# Patient Record
Sex: Male | Born: 1945 | Race: Black or African American | Hispanic: No | Marital: Single | Smoking: Former smoker
Health system: Southern US, Community
[De-identification: ages and names within clinical notes are randomized; demographics above are authoritative.]

## PROBLEM LIST (undated history)

## (undated) DIAGNOSIS — C801 Malignant (primary) neoplasm, unspecified: Secondary | ICD-10-CM

## (undated) DIAGNOSIS — J189 Pneumonia, unspecified organism: Secondary | ICD-10-CM

## (undated) DIAGNOSIS — M1990 Unspecified osteoarthritis, unspecified site: Secondary | ICD-10-CM

## (undated) DIAGNOSIS — T4145XA Adverse effect of unspecified anesthetic, initial encounter: Secondary | ICD-10-CM

## (undated) HISTORY — PX: HERNIA REPAIR: SHX51

## (undated) HISTORY — PX: PROSTATECTOMY: SHX69

---
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
## 2018-03-23 ENCOUNTER — Ambulatory Visit (INDEPENDENT_AMBULATORY_CARE_PROVIDER_SITE_OTHER): Payer: Medicare Other | Admitting: Orthopaedic Surgery

## 2018-04-01 ENCOUNTER — Ambulatory Visit (INDEPENDENT_AMBULATORY_CARE_PROVIDER_SITE_OTHER): Payer: Medicare Other | Admitting: Orthopaedic Surgery

## 2018-04-01 ENCOUNTER — Other Ambulatory Visit (INDEPENDENT_AMBULATORY_CARE_PROVIDER_SITE_OTHER): Payer: Self-pay

## 2018-04-01 ENCOUNTER — Ambulatory Visit (INDEPENDENT_AMBULATORY_CARE_PROVIDER_SITE_OTHER): Payer: Medicare Other

## 2018-04-01 DIAGNOSIS — M25552 Pain in left hip: Secondary | ICD-10-CM | POA: Diagnosis not present

## 2018-04-01 DIAGNOSIS — M1611 Unilateral primary osteoarthritis, right hip: Secondary | ICD-10-CM

## 2018-04-01 DIAGNOSIS — M1612 Unilateral primary osteoarthritis, left hip: Secondary | ICD-10-CM | POA: Diagnosis not present

## 2018-04-01 DIAGNOSIS — M25551 Pain in right hip: Secondary | ICD-10-CM

## 2018-04-01 NOTE — Progress Notes (Signed)
Office Visit Note   Patient: [name]           Date of Birth: September 01, 1945           MRN: [id number] Visit Date: 04/01/2018              Requested by: Leanna Battles, MD [address] [place], [place] [address] PCP: Leanna Battles, MD   Assessment & Plan: Visit Diagnoses:  1. Pain in right hip   2. Unilateral primary osteoarthritis, left hip   3. Unilateral primary osteoarthritis, right hip   4. Pain of left hip joint     Plan: I gave the patient a copy of his x-rays.  He understands that he has severe end-stage arthritis and that running is probably putting more pressure on the hips however he does not want to stop running.  He would like to try at least a one-time intra-articular injection into his right hip and we can set that up under direct fluoroscopy by Dr. Ernestina Patches in the next week or so.  I will see him back in about 2 months to see how he is doing overall.  I did tell him I would not repeat injections over nerve in his hip and he understands that as well.  All question concerns were answered and addressed.  Follow-Up Instructions: Return in about 2 months (around 06/01/2018).   Orders:  Orders Placed This Encounter  Procedures  . XR HIP UNILAT W OR W/O PELVIS 1V RIGHT   No orders of the defined types were placed in this encounter.     Procedures: No procedures performed   Clinical Data: No additional findings.   Subjective: Chief Complaint  Patient presents with  . Right Hip - Pain  The patient is a 73 year old thin avid runner who is been running for many years now.  He comes in with a several year history of worsening right hip pain and some left hip pain and stiffness.  He is running again in April in terms of a 5K race.  He says he never wants to back off on his running.  He does not walk with an assistive device.  He takes anti-inflammatories as needed.  His pain is daily and is in the groin on both sides.  He said both hips are stiff and he cannot put  on his shoes and socks well or cross his legs well.  HPI  Review of Systems He currently denies any headache, chest pain, shortness of breath, fever, chills, nausea, vomiting.  He denies any active medical problems.  Objective: Vital Signs: There were no vitals taken for this visit.  Physical Exam He is alert and orient x3 and in no acute distress Ortho Exam Examination shows he is very thin individual.  Both hips have good flexion extension but essentially almost no internal or external rotation due to stiffness and some pain. Specialty Comments:  No specialty comments available.  Imaging: Xr Hip Unilat W Or W/o Pelvis 1v Right  Result Date: 04/01/2018 An AP pelvis and lateral the right hip show severe end-stage arthritis of actually both hips.  There are sclerotic changes in the femoral head and acetabulum both sides with flattening of each femoral head.  There is complete loss of the superior lateral joint space on both hips    PMFS History: Patient Active Problem List   Diagnosis Date Noted  . Unilateral primary osteoarthritis, left hip 04/01/2018  . Unilateral primary osteoarthritis, right hip 04/01/2018  No past medical history on file.  No family history on file.

## 2018-04-06 ENCOUNTER — Other Ambulatory Visit (INDEPENDENT_AMBULATORY_CARE_PROVIDER_SITE_OTHER): Payer: Self-pay

## 2018-04-06 ENCOUNTER — Telehealth (INDEPENDENT_AMBULATORY_CARE_PROVIDER_SITE_OTHER): Payer: Self-pay | Admitting: Orthopaedic Surgery

## 2018-04-06 DIAGNOSIS — M25552 Pain in left hip: Principal | ICD-10-CM

## 2018-04-06 DIAGNOSIS — M25551 Pain in right hip: Secondary | ICD-10-CM

## 2018-04-06 NOTE — Telephone Encounter (Signed)
Sent order to referral pool

## 2018-04-06 NOTE — Telephone Encounter (Signed)
Ok to set him up for a steroid inject in each hip joint by [facility] under c-arm.

## 2018-04-06 NOTE — Telephone Encounter (Signed)
Ok

## 2018-04-06 NOTE — Telephone Encounter (Signed)
Patient lmom requesting a call back from Dr Ninfa Linden regarding hip injections. Patient states he would like to have hip injections for right now until he can get the surgery done.

## 2018-04-21 ENCOUNTER — Other Ambulatory Visit: Payer: Self-pay | Admitting: Urology

## 2018-04-21 ENCOUNTER — Other Ambulatory Visit (HOSPITAL_COMMUNITY): Payer: Self-pay | Admitting: Urology

## 2018-04-21 DIAGNOSIS — C61 Malignant neoplasm of prostate: Secondary | ICD-10-CM

## 2018-04-23 ENCOUNTER — Ambulatory Visit (INDEPENDENT_AMBULATORY_CARE_PROVIDER_SITE_OTHER): Payer: Self-pay | Admitting: Physical Medicine and Rehabilitation

## 2018-04-24 ENCOUNTER — Other Ambulatory Visit: Payer: Self-pay

## 2018-04-24 ENCOUNTER — Ambulatory Visit (HOSPITAL_COMMUNITY)
Admission: RE | Admit: 2018-04-24 | Discharge: 2018-04-24 | Disposition: A | Discharge status: Home / Self Care | Payer: Medicare Other | Source: Ambulatory Visit | Attending: Urology | Admitting: Urology

## 2018-04-24 DIAGNOSIS — K760 Fatty (change of) liver, not elsewhere classified: Secondary | ICD-10-CM | POA: Insufficient documentation

## 2018-04-24 DIAGNOSIS — C61 Malignant neoplasm of prostate: Secondary | ICD-10-CM | POA: Insufficient documentation

## 2018-04-24 DIAGNOSIS — R918 Other nonspecific abnormal finding of lung field: Secondary | ICD-10-CM | POA: Insufficient documentation

## 2018-04-24 LAB — GLUCOSE, CAPILLARY: Glucose-Capillary: 88 mg/dL (ref 70–99)

## 2018-04-24 MED ORDER — FLUDEOXYGLUCOSE F - 18 (FDG) INJECTION
7.3000 | Freq: Once | INTRAVENOUS | Status: AC | PRN
  Administered 2018-04-24: 7.3 via INTRAVENOUS

## 2018-05-11 ENCOUNTER — Ambulatory Visit (INDEPENDENT_AMBULATORY_CARE_PROVIDER_SITE_OTHER): Payer: Self-pay | Admitting: Physical Medicine and Rehabilitation

## 2018-06-01 ENCOUNTER — Ambulatory Visit: Payer: Self-pay | Admitting: Orthopaedic Surgery

## 2018-06-08 ENCOUNTER — Ambulatory Visit: Payer: Self-pay | Admitting: Physical Medicine and Rehabilitation

## 2018-06-19 ENCOUNTER — Telehealth: Payer: Self-pay | Admitting: Radiology

## 2018-06-19 NOTE — Telephone Encounter (Signed)
Returning call for appt

## 2018-06-24 NOTE — Telephone Encounter (Signed)
Scheduled for 6/10 at 1030 for bilateral hip injections.

## 2018-07-08 ENCOUNTER — Ambulatory Visit: Payer: Self-pay

## 2018-07-08 ENCOUNTER — Telehealth: Payer: Self-pay | Admitting: Orthopaedic Surgery

## 2018-07-08 ENCOUNTER — Ambulatory Visit (INDEPENDENT_AMBULATORY_CARE_PROVIDER_SITE_OTHER): Payer: Medicare Other | Admitting: Physical Medicine and Rehabilitation

## 2018-07-08 ENCOUNTER — Other Ambulatory Visit: Payer: Self-pay

## 2018-07-08 ENCOUNTER — Encounter: Payer: Self-pay | Admitting: Physical Medicine and Rehabilitation

## 2018-07-08 DIAGNOSIS — M1612 Unilateral primary osteoarthritis, left hip: Secondary | ICD-10-CM | POA: Diagnosis not present

## 2018-07-08 DIAGNOSIS — M1611 Unilateral primary osteoarthritis, right hip: Secondary | ICD-10-CM

## 2018-07-08 NOTE — Telephone Encounter (Signed)
I will give a surgery schedule sheet to [facility] to try to get this scheduled.

## 2018-07-08 NOTE — Telephone Encounter (Signed)
Scheduling THA?

## 2018-07-08 NOTE — Progress Notes (Signed)
  Numeric Pain Rating Scale and Functional Assessment Average Pain 6   In the last MONTH (on 0-10 scale) has pain interfered with the following?  1. General activity like being  able to carry out your everyday physical activities such as walking, climbing stairs, carrying groceries, or moving a chair?  Rating(0)   -Dye Allergies.

## 2018-08-18 DIAGNOSIS — M1612 Unilateral primary osteoarthritis, left hip: Secondary | ICD-10-CM | POA: Diagnosis not present

## 2018-08-18 DIAGNOSIS — M1611 Unilateral primary osteoarthritis, right hip: Secondary | ICD-10-CM

## 2018-08-18 MED ORDER — TRIAMCINOLONE ACETONIDE 40 MG/ML IJ SUSP
60.0000 mg | INTRAMUSCULAR | Status: AC | PRN
  Administered 2018-08-18: 60 mg via INTRA_ARTICULAR

## 2018-08-18 MED ORDER — BUPIVACAINE HCL 0.25 % IJ SOLN
4.0000 mL | INTRAMUSCULAR | Status: AC | PRN
  Administered 2018-08-18: 06:00:00 4 mL via INTRA_ARTICULAR

## 2018-08-18 MED ORDER — BUPIVACAINE HCL 0.25 % IJ SOLN
4.0000 mL | INTRAMUSCULAR | Status: AC | PRN
  Administered 2018-08-18: 4 mL via INTRA_ARTICULAR

## 2018-08-18 NOTE — Progress Notes (Signed)
   Masao Junker - 73 y.o. male MRN [id number]  Date of birth: 02/03/1945  Office Visit Note: Visit Date: 07/08/2018 PCP: Leanna Battles, MD Referred by: Leanna Battles, MD  Subjective: Chief Complaint  Patient presents with  . Right Hip - Pain  . Left Hip - Pain   HPI:  Deano Tomaszewski is a 73 y.o. male who comes in today For intra-articular bilateral hip injections using fluoroscopic guidance as requested by Dr. Jean Rosenthal.  Patient's been having chronic worsening bilateral hip and groin pain with x-rays revealing end-stage osteoarthritis.  ROS Otherwise per HPI.  Assessment & Plan: Visit Diagnoses:  1. Unilateral primary osteoarthritis, left hip   2. Unilateral primary osteoarthritis, right hip     Plan: No additional findings.   Meds & Orders: No orders of the defined types were placed in this encounter.   Orders Placed This Encounter  Procedures  . Large Joint Inj  . XR C-ARM NO REPORT    Follow-up: No follow-ups on file.   Procedures: Large Joint Inj: bilateral hip joint on 08/18/2018 6:22 AM Indications: diagnostic evaluation and pain Details: 22 G 3.5 in needle, fluoroscopy-guided anterior approach  Arthrogram: No  Medications (Right): 4 mL bupivacaine 0.25 %; 60 mg triamcinolone acetonide 40 MG/ML Medications (Left): 4 mL bupivacaine 0.25 %; 60 mg triamcinolone acetonide 40 MG/ML Outcome: tolerated well, no immediate complications  There was excellent flow of contrast producing a partial arthrogram of the hips. The patient did have relief of symptoms during the anesthetic phase of the injection. Procedure, treatment alternatives, risks and benefits explained, specific risks discussed. Consent was given by the patient. Immediately prior to procedure a time out was called to verify the correct patient, procedure, equipment, support staff and site/side marked as required. Patient was prepped and draped in the usual sterile fashion.      No notes on file   Clinical History: No specialty comments available.     Objective:  VS:  HT:    WT:   BMI:     BP:   HR: bpm  TEMP: ( )  RESP:  Physical Exam  Ortho Exam Imaging: No results found.

## 2018-10-06 ENCOUNTER — Other Ambulatory Visit: Payer: Self-pay | Admitting: Physician Assistant

## 2018-10-08 ENCOUNTER — Other Ambulatory Visit: Payer: Self-pay

## 2018-10-08 ENCOUNTER — Encounter (HOSPITAL_COMMUNITY)
Admission: RE | Admit: 2018-10-08 | Discharge: 2018-10-08 | Disposition: A | Discharge status: Home / Self Care | Payer: Medicare Other | Source: Ambulatory Visit | Attending: Orthopaedic Surgery | Admitting: Orthopaedic Surgery

## 2018-10-08 ENCOUNTER — Encounter (HOSPITAL_COMMUNITY): Payer: Self-pay

## 2018-10-08 DIAGNOSIS — Z20828 Contact with and (suspected) exposure to other viral communicable diseases: Secondary | ICD-10-CM | POA: Diagnosis not present

## 2018-10-08 DIAGNOSIS — M1611 Unilateral primary osteoarthritis, right hip: Secondary | ICD-10-CM | POA: Insufficient documentation

## 2018-10-08 DIAGNOSIS — Z01812 Encounter for preprocedural laboratory examination: Secondary | ICD-10-CM | POA: Insufficient documentation

## 2018-10-08 HISTORY — DX: Pneumonia, unspecified organism: J18.9

## 2018-10-08 HISTORY — DX: Unspecified osteoarthritis, unspecified site: M19.90

## 2018-10-08 HISTORY — DX: Malignant (primary) neoplasm, unspecified: C80.1

## 2018-10-08 LAB — CBC
HCT: 40 % (ref 39.0–52.0)
Hemoglobin: 12.7 g/dL — ABNORMAL LOW (ref 13.0–17.0)
MCH: 31.3 pg (ref 26.0–34.0)
MCHC: 31.8 g/dL (ref 30.0–36.0)
MCV: 98.5 fL (ref 80.0–100.0)
Platelets: 217 10*3/uL (ref 150–400)
RBC: 4.06 MIL/uL — ABNORMAL LOW (ref 4.22–5.81)
RDW: 13.6 % (ref 11.5–15.5)
WBC: 5.3 10*3/uL (ref 4.0–10.5)
nRBC: 0 % (ref 0.0–0.2)

## 2018-10-08 LAB — BASIC METABOLIC PANEL
Anion gap: 10 (ref 5–15)
BUN: 13 mg/dL (ref 8–23)
CO2: 23 mmol/L (ref 22–32)
Calcium: 9.7 mg/dL (ref 8.9–10.3)
Chloride: 110 mmol/L (ref 98–111)
Creatinine, Ser: 1.18 mg/dL (ref 0.61–1.24)
GFR calc Af Amer: >60 mL/min (ref >60)
GFR calc non Af Amer: >60 mL/min (ref >60)
Glucose, Bld: 94 mg/dL (ref 70–99)
Potassium: 4.1 mmol/L (ref 3.5–5.1)
Sodium: 143 mmol/L (ref 135–145)

## 2018-10-08 LAB — SURGICAL PCR SCREEN
MRSA, PCR: NEGATIVE
Staphylococcus aureus: NEGATIVE

## 2018-10-08 NOTE — Progress Notes (Signed)
PCP - Dr. Leanna Battles Cardiologist - denies  Chest x-ray - n/a EKG - n/a Stress Test - denies ECHO - denies Cardiac Cath - denies  Sleep Study - denies CPAP - n/a  Fasting Blood Sugar - n/a Checks Blood Sugar _____ times a day  Blood Thinner Instructions: n/a Aspirin Instructions: n/a  Anesthesia review: No  Patient denies shortness of breath, fever, cough and chest pain at PAT appointment   Patient verbalized understanding of instructions that were given to them at the PAT appointment. Patient was also instructed that they will need to review over the PAT instructions again at home before surgery.  Pt will have Covid test done tomorrow, 10/09/18. Pt given quarantine instructions and voiced understanding.   Coronavirus Screening  Have you experienced the following symptoms:  Cough yes/no: No Fever (>100.42F)  yes/no: No Runny nose yes/no: No Sore throat yes/no: No Difficulty breathing/shortness of breath  yes/no: No  Have you or a family member traveled in the last 14 days and where? yes/no: No   Patient reminded that hospital visitation restrictions are in effect and the importance of the restrictions. Pt informed he may have 1 visitor wait in waiting area while he is in pre-op, surgery and PACU. That one visitor then can visit him once he is admitted during visiting hours only (10 AM - 8 PM). Pt voiced understanding.

## 2018-10-08 NOTE — Pre-Procedure Instructions (Addendum)
[name]  10/08/2018    Your procedure is scheduled on Tuesday, October 13, 2018 at 12 noon.   Report to [facility] Entrance "A" Admitting Office at 10:00 AM.   Call this number if you have problems the morning of surgery: [phone]   Questions prior to day of surgery, please call [phone] between 8 & 4 PM.   Remember:  Do not eat food after midnight Monday, 10/12/18.  You may drink clear liquids until 9:00 AM.  Clear liquids allowed are:  Water, Juice (non-citric and without pulp), Carbonated beverages, Clear Tea, Black Coffee only, Plain Jell-O only, Gatorade and Plain Popsicles only  Drink the Pre-Surgery Ensure between 8:45 - 9:00 AM day of surgery.    Take these medicines the morning of surgery with A SIP OF WATER: None  Do not use Aspirin products (BC Powders, Goody's, etc), NSAIDS (Ibuprofen, Aleve, etc), Multivitamins, Fish Oil or Herbal medications    Do not wear jewelry.  Do not wear lotions, powders, cologne or deodorant.  Men may shave face and neck.  Do not bring valuables to the hospital.  [facility] is not responsible for any belongings or valuables.  Contacts, dentures or bridgework may not be worn into surgery.  Leave your suitcase in the car.  After surgery it may be brought to your room.  For patients admitted to the hospital, discharge time will be determined by your treatment team.  [facility] - Preparing for Surgery  Before surgery, you can play an important role.  Because skin is not sterile, your skin needs to be as free of germs as possible.  You can reduce the number of germs on you skin by washing with CHG (chlorahexidine gluconate) soap before surgery.  CHG is an antiseptic cleaner which kills germs and bonds with the skin to continue killing germs even after washing.  Oral Hygiene is also important in reducing the risk of infection.  Remember to brush your teeth with your regular toothpaste the morning of surgery.  Please DO NOT  use if you have an allergy to CHG or antibacterial soaps.  If your skin becomes reddened/irritated stop using the CHG and inform your nurse when you arrive at Short Stay.  Do not shave (including legs and underarms) for at least 48 hours prior to the first CHG shower.  You may shave your face.  Please follow these instructions carefully:   1.  Shower with CHG Soap the night before surgery and the morning of Surgery.  2.  If you choose to wash your hair, wash your hair first as usual with your normal shampoo.  3.  After you shampoo, rinse your hair and body thoroughly to remove the shampoo. 4.  Use CHG as you would any other liquid soap.  You can apply chg directly to the skin and wash gently with a      scrungie or washcloth.           5.  Apply the CHG Soap to your body ONLY FROM THE NECK DOWN.   Do not use on open wounds or open sores. Avoid contact with your eyes, ears, mouth and genitals (private parts).  Wash genitals (private parts) with your normal soap - do this prior to using CHG soap.  6.  Wash thoroughly, paying special attention to the area where your surgery will be performed.  7.  Thoroughly rinse your body with warm water from the neck down.  8.  DO NOT  shower/wash with your normal soap after using and rinsing off the CHG Soap.  9.  Pat yourself dry with a clean towel.            10.  Wear clean pajamas.            11.  Place clean sheets on your bed the night of your first shower and do not sleep with pets.  Day of Surgery  Shower as above. Do not apply any lotions/deodorants the morning of surgery.   Please wear clean clothes to the hospital. Remember to brush your teeth with toothpaste.  Please read over the fact sheets that you were given.

## 2018-10-08 NOTE — Progress Notes (Signed)
Pt had an appt scheduled at 9:00 AM, I called pt at 9:10 AM when he had not yet arrived. No answer when I called, left message for pt to return call.

## 2018-10-09 ENCOUNTER — Other Ambulatory Visit (HOSPITAL_COMMUNITY)
Admission: RE | Admit: 2018-10-09 | Discharge: 2018-10-09 | Disposition: A | Discharge status: Home / Self Care | Payer: Medicare Other | Source: Ambulatory Visit | Attending: Orthopaedic Surgery | Admitting: Orthopaedic Surgery

## 2018-10-09 DIAGNOSIS — Z01812 Encounter for preprocedural laboratory examination: Secondary | ICD-10-CM | POA: Diagnosis not present

## 2018-10-11 LAB — NOVEL CORONAVIRUS, NAA (HOSP ORDER, SEND-OUT TO REF LAB; TAT 18-24 HRS): SARS-CoV-2, NAA: NOT DETECTED

## 2018-10-13 ENCOUNTER — Observation Stay (HOSPITAL_COMMUNITY): Payer: Medicare Other

## 2018-10-13 ENCOUNTER — Encounter (HOSPITAL_COMMUNITY): Payer: Self-pay

## 2018-10-13 ENCOUNTER — Observation Stay (HOSPITAL_COMMUNITY)
Admission: RE | Admit: 2018-10-13 | Discharge: 2018-10-14 | Disposition: A | Discharge status: Home Health | Payer: Medicare Other | Attending: Orthopaedic Surgery | Admitting: Orthopaedic Surgery

## 2018-10-13 ENCOUNTER — Other Ambulatory Visit: Payer: Self-pay

## 2018-10-13 ENCOUNTER — Ambulatory Visit (HOSPITAL_COMMUNITY): Payer: Medicare Other | Admitting: Certified Registered"

## 2018-10-13 ENCOUNTER — Ambulatory Visit (HOSPITAL_COMMUNITY): Payer: Medicare Other

## 2018-10-13 ENCOUNTER — Encounter (HOSPITAL_COMMUNITY)
Admission: RE | Disposition: A | Discharge status: Home Health | Payer: Self-pay | Source: Home / Self Care | Attending: Orthopaedic Surgery

## 2018-10-13 DIAGNOSIS — Z8546 Personal history of malignant neoplasm of prostate: Secondary | ICD-10-CM | POA: Diagnosis not present

## 2018-10-13 DIAGNOSIS — Z87891 Personal history of nicotine dependence: Secondary | ICD-10-CM | POA: Insufficient documentation

## 2018-10-13 DIAGNOSIS — Z96641 Presence of right artificial hip joint: Secondary | ICD-10-CM

## 2018-10-13 DIAGNOSIS — M1611 Unilateral primary osteoarthritis, right hip: Principal | ICD-10-CM | POA: Insufficient documentation

## 2018-10-13 DIAGNOSIS — Z419 Encounter for procedure for purposes other than remedying health state, unspecified: Secondary | ICD-10-CM

## 2018-10-13 HISTORY — PX: TOTAL HIP ARTHROPLASTY: SHX124

## 2018-10-13 SURGERY — ARTHROPLASTY, HIP, TOTAL, ANTERIOR APPROACH
Anesthesia: Spinal | Site: Hip | Laterality: Right

## 2018-10-13 MED ORDER — PROPOFOL 10 MG/ML IV BOLUS
INTRAVENOUS | Status: DC | PRN
  Administered 2018-10-13: 50 mg via INTRAVENOUS

## 2018-10-13 MED ORDER — CHLORHEXIDINE GLUCONATE 4 % EX LIQD: 60.0000 mL | Freq: Once | CUTANEOUS | Status: DC

## 2018-10-13 MED ORDER — FENTANYL CITRATE (PF) 100 MCG/2ML IJ SOLN
INTRAMUSCULAR | Status: DC | PRN
  Administered 2018-10-13: 50 ug via INTRAVENOUS

## 2018-10-13 MED ORDER — FENTANYL CITRATE (PF) 250 MCG/5ML IJ SOLN
INTRAMUSCULAR | Status: AC
  Filled 2018-10-13: qty 5

## 2018-10-13 MED ORDER — TRANEXAMIC ACID-NACL 1000-0.7 MG/100ML-% IV SOLN
1000.0000 mg | INTRAVENOUS | Status: AC
  Administered 2018-10-13: 1000 mg via INTRAVENOUS
  Filled 2018-10-13: qty 100

## 2018-10-13 MED ORDER — FENTANYL CITRATE (PF) 100 MCG/2ML IJ SOLN
INTRAMUSCULAR | Status: AC
  Filled 2018-10-13: qty 2

## 2018-10-13 MED ORDER — DIPHENHYDRAMINE HCL 12.5 MG/5ML PO ELIX: 12.5000 mg | ORAL_SOLUTION | ORAL | Status: DC | PRN

## 2018-10-13 MED ORDER — PANTOPRAZOLE SODIUM 40 MG PO TBEC
40.0000 mg | DELAYED_RELEASE_TABLET | Freq: Every day | ORAL | Status: DC
  Administered 2018-10-14: 40 mg via ORAL
  Filled 2018-10-13: qty 1

## 2018-10-13 MED ORDER — PROPOFOL 500 MG/50ML IV EMUL
INTRAVENOUS | Status: DC | PRN
  Administered 2018-10-13: 50 ug/kg/min via INTRAVENOUS

## 2018-10-13 MED ORDER — SODIUM CHLORIDE 0.9 % IR SOLN
Status: DC | PRN
  Administered 2018-10-13: 3000 mL

## 2018-10-13 MED ORDER — MIDAZOLAM HCL 2 MG/2ML IJ SOLN
INTRAMUSCULAR | Status: AC
  Filled 2018-10-13: qty 2

## 2018-10-13 MED ORDER — HYDROMORPHONE HCL 1 MG/ML IJ SOLN
0.5000 mg | INTRAMUSCULAR | Status: DC | PRN
  Administered 2018-10-13 – 2018-10-14 (×4): 1 mg via INTRAVENOUS
  Filled 2018-10-13 (×4): qty 1

## 2018-10-13 MED ORDER — BUPIVACAINE IN DEXTROSE 0.75-8.25 % IT SOLN
INTRATHECAL | Status: DC | PRN
  Administered 2018-10-13: 1.8 mL via INTRATHECAL

## 2018-10-13 MED ORDER — KETOROLAC TROMETHAMINE 15 MG/ML IJ SOLN
15.0000 mg | Freq: Once | INTRAMUSCULAR | Status: AC | PRN
  Administered 2018-10-13: 15 mg via INTRAVENOUS

## 2018-10-13 MED ORDER — CEFAZOLIN SODIUM-DEXTROSE 1-4 GM/50ML-% IV SOLN
1.0000 g | Freq: Four times a day (QID) | INTRAVENOUS | Status: AC
  Administered 2018-10-13 – 2018-10-14 (×2): 1 g via INTRAVENOUS
  Filled 2018-10-13 (×2): qty 50

## 2018-10-13 MED ORDER — ALUM & MAG HYDROXIDE-SIMETH 200-200-20 MG/5ML PO SUSP: 30.0000 mL | ORAL | Status: DC | PRN

## 2018-10-13 MED ORDER — PROPOFOL 10 MG/ML IV BOLUS
INTRAVENOUS | Status: AC
  Filled 2018-10-13: qty 20

## 2018-10-13 MED ORDER — METHOCARBAMOL 1000 MG/10ML IJ SOLN
500.0000 mg | Freq: Four times a day (QID) | INTRAVENOUS | Status: DC | PRN
  Filled 2018-10-13: qty 5

## 2018-10-13 MED ORDER — ZOLPIDEM TARTRATE 5 MG PO TABS: 5.0000 mg | ORAL_TABLET | Freq: Every evening | ORAL | Status: DC | PRN

## 2018-10-13 MED ORDER — PHENOL 1.4 % MT LIQD: 1.0000 | OROMUCOSAL | Status: DC | PRN

## 2018-10-13 MED ORDER — 0.9 % SODIUM CHLORIDE (POUR BTL) OPTIME
TOPICAL | Status: DC | PRN
  Administered 2018-10-13: 1000 mL

## 2018-10-13 MED ORDER — METOCLOPRAMIDE HCL 5 MG PO TABS: 5.0000 mg | ORAL_TABLET | Freq: Three times a day (TID) | ORAL | Status: DC | PRN

## 2018-10-13 MED ORDER — KETOROLAC TROMETHAMINE 15 MG/ML IJ SOLN
INTRAMUSCULAR | Status: AC
  Filled 2018-10-13: qty 1

## 2018-10-13 MED ORDER — POVIDONE-IODINE 10 % EX SWAB
2.0000 "application " | Freq: Once | CUTANEOUS | Status: AC
  Administered 2018-10-13: 2 via TOPICAL

## 2018-10-13 MED ORDER — LACTATED RINGERS IV SOLN
INTRAVENOUS | Status: DC
  Administered 2018-10-13: 09:00:00 via INTRAVENOUS

## 2018-10-13 MED ORDER — METOCLOPRAMIDE HCL 5 MG/ML IJ SOLN: 5.0000 mg | Freq: Three times a day (TID) | INTRAMUSCULAR | Status: DC | PRN

## 2018-10-13 MED ORDER — HYDROMORPHONE HCL 1 MG/ML IJ SOLN: 0.2500 mg | INTRAMUSCULAR | Status: DC | PRN

## 2018-10-13 MED ORDER — PROMETHAZINE HCL 25 MG/ML IJ SOLN: 6.2500 mg | INTRAMUSCULAR | Status: DC | PRN

## 2018-10-13 MED ORDER — METHOCARBAMOL 500 MG PO TABS
500.0000 mg | ORAL_TABLET | Freq: Four times a day (QID) | ORAL | Status: DC | PRN
  Administered 2018-10-13 – 2018-10-14 (×3): 500 mg via ORAL
  Filled 2018-10-13 (×2): qty 1

## 2018-10-13 MED ORDER — OXYCODONE HCL 5 MG PO TABS
10.0000 mg | ORAL_TABLET | ORAL | Status: DC | PRN
  Administered 2018-10-14 (×3): 15 mg via ORAL
  Filled 2018-10-13 (×3): qty 3

## 2018-10-13 MED ORDER — SODIUM CHLORIDE 0.9 % IV SOLN
INTRAVENOUS | Status: DC
  Administered 2018-10-13 – 2018-10-14 (×2): via INTRAVENOUS

## 2018-10-13 MED ORDER — ROCURONIUM BROMIDE 10 MG/ML (PF) SYRINGE
PREFILLED_SYRINGE | INTRAVENOUS | Status: AC
  Filled 2018-10-13: qty 10

## 2018-10-13 MED ORDER — OXYCODONE HCL 5 MG PO TABS: 5.0000 mg | ORAL_TABLET | Freq: Once | ORAL | Status: DC

## 2018-10-13 MED ORDER — ONDANSETRON HCL 4 MG PO TABS: 4.0000 mg | ORAL_TABLET | Freq: Four times a day (QID) | ORAL | Status: DC | PRN

## 2018-10-13 MED ORDER — DOCUSATE SODIUM 100 MG PO CAPS
100.0000 mg | ORAL_CAPSULE | Freq: Two times a day (BID) | ORAL | Status: DC
  Administered 2018-10-13 – 2018-10-14 (×2): 100 mg via ORAL
  Filled 2018-10-13 (×2): qty 1

## 2018-10-13 MED ORDER — ONDANSETRON HCL 4 MG/2ML IJ SOLN
4.0000 mg | Freq: Four times a day (QID) | INTRAMUSCULAR | Status: DC | PRN
  Administered 2018-10-14: 4 mg via INTRAVENOUS
  Filled 2018-10-13: qty 2

## 2018-10-13 MED ORDER — OXYCODONE HCL 5 MG PO TABS
5.0000 mg | ORAL_TABLET | ORAL | Status: DC | PRN
  Administered 2018-10-13: 10 mg via ORAL
  Filled 2018-10-13: qty 2

## 2018-10-13 MED ORDER — ACETAMINOPHEN 325 MG PO TABS: 325.0000 mg | ORAL_TABLET | Freq: Four times a day (QID) | ORAL | Status: DC | PRN

## 2018-10-13 MED ORDER — MENTHOL 3 MG MT LOZG: 1.0000 | LOZENGE | OROMUCOSAL | Status: DC | PRN

## 2018-10-13 MED ORDER — METHOCARBAMOL 500 MG PO TABS
ORAL_TABLET | ORAL | Status: AC
  Filled 2018-10-13: qty 1

## 2018-10-13 MED ORDER — SUGAMMADEX SODIUM 500 MG/5ML IV SOLN
INTRAVENOUS | Status: AC
  Filled 2018-10-13: qty 5

## 2018-10-13 MED ORDER — ASPIRIN 81 MG PO CHEW
81.0000 mg | CHEWABLE_TABLET | Freq: Two times a day (BID) | ORAL | Status: DC
  Administered 2018-10-13 – 2018-10-14 (×2): 81 mg via ORAL
  Filled 2018-10-13 (×2): qty 1

## 2018-10-13 MED ORDER — ACETAMINOPHEN 500 MG PO TABS
1000.0000 mg | ORAL_TABLET | Freq: Once | ORAL | Status: AC
  Administered 2018-10-13: 09:00:00 1000 mg via ORAL
  Filled 2018-10-13: qty 2

## 2018-10-13 MED ORDER — POLYETHYLENE GLYCOL 3350 17 G PO PACK: 17.0000 g | PACK | Freq: Every day | ORAL | Status: DC | PRN

## 2018-10-13 MED ORDER — SUCCINYLCHOLINE CHLORIDE 200 MG/10ML IV SOSY
PREFILLED_SYRINGE | INTRAVENOUS | Status: AC
  Filled 2018-10-13: qty 10

## 2018-10-13 MED ORDER — TRANEXAMIC ACID-NACL 1000-0.7 MG/100ML-% IV SOLN
INTRAVENOUS | Status: AC
  Filled 2018-10-13: qty 100

## 2018-10-13 MED ORDER — HYDRALAZINE HCL 20 MG/ML IJ SOLN
5.0000 mg | Freq: Four times a day (QID) | INTRAMUSCULAR | Status: DC | PRN
  Administered 2018-10-13: 5 mg via INTRAVENOUS
  Filled 2018-10-13: qty 1

## 2018-10-13 MED ORDER — CEFAZOLIN SODIUM-DEXTROSE 2-4 GM/100ML-% IV SOLN
INTRAVENOUS | Status: AC
  Filled 2018-10-13: qty 100

## 2018-10-13 MED ORDER — FENTANYL CITRATE (PF) 100 MCG/2ML IJ SOLN
25.0000 ug | INTRAMUSCULAR | Status: DC | PRN
  Administered 2018-10-13 (×2): 50 ug via INTRAVENOUS

## 2018-10-13 MED ORDER — EPHEDRINE SULFATE-NACL 50-0.9 MG/10ML-% IV SOSY
PREFILLED_SYRINGE | INTRAVENOUS | Status: DC | PRN
  Administered 2018-10-13: 10 mg via INTRAVENOUS

## 2018-10-13 MED ORDER — MIDAZOLAM HCL 5 MG/5ML IJ SOLN
INTRAMUSCULAR | Status: DC | PRN
  Administered 2018-10-13: 2 mg via INTRAVENOUS

## 2018-10-13 MED ORDER — CEFAZOLIN SODIUM-DEXTROSE 2-4 GM/100ML-% IV SOLN
2.0000 g | INTRAVENOUS | Status: AC
  Administered 2018-10-13: 13:00:00 2 g via INTRAVENOUS

## 2018-10-13 MED ORDER — LIDOCAINE 2% (20 MG/ML) 5 ML SYRINGE
INTRAMUSCULAR | Status: AC
  Filled 2018-10-13: qty 5

## 2018-10-13 SURGICAL SUPPLY — 61 items
ACETAB CUP W GRIPTION 54MM (Plate) ×1 IMPLANT
ACETAB CUP W/GRIPTION 54 (Plate) ×2 IMPLANT
BENZOIN TINCTURE PRP APPL 2/3 (GAUZE/BANDAGES/DRESSINGS) ×3 IMPLANT
BLADE CLIPPER SURG (BLADE) IMPLANT
BLADE SAW SGTL 18X1.27X75 (BLADE) ×2 IMPLANT
BLADE SAW SGTL 18X1.27X75MM (BLADE) ×1
CLOSURE STERI-STRIP 1/2X4 (GAUZE/BANDAGES/DRESSINGS) ×1
CLOSURE WOUND 1/2 X4 (GAUZE/BANDAGES/DRESSINGS)
CLSR STERI-STRIP ANTIMIC 1/2X4 (GAUZE/BANDAGES/DRESSINGS) ×1 IMPLANT
COVER SURGICAL LIGHT HANDLE (MISCELLANEOUS) ×3 IMPLANT
COVER WAND RF STERILE (DRAPES) ×3 IMPLANT
CUP ACETAB W/GRIPTION 54 (Plate) IMPLANT
DRAPE C-ARM 42X72 X-RAY (DRAPES) ×3 IMPLANT
DRAPE STERI IOBAN 125X83 (DRAPES) ×3 IMPLANT
DRAPE U-SHAPE 47X51 STRL (DRAPES) ×9 IMPLANT
DRSG AQUACEL AG ADV 3.5X10 (GAUZE/BANDAGES/DRESSINGS) ×3 IMPLANT
DURAPREP 26ML APPLICATOR (WOUND CARE) ×3 IMPLANT
ELECT BLADE 4.0 EZ CLEAN MEGAD (MISCELLANEOUS) ×3
ELECT BLADE 6.5 EXT (BLADE) IMPLANT
ELECT REM PT RETURN 9FT ADLT (ELECTROSURGICAL) ×3
ELECTRODE BLDE 4.0 EZ CLN MEGD (MISCELLANEOUS) ×1 IMPLANT
ELECTRODE REM PT RTRN 9FT ADLT (ELECTROSURGICAL) ×1 IMPLANT
FACESHIELD WRAPAROUND (MASK) ×6 IMPLANT
FACESHIELD WRAPAROUND OR TEAM (MASK) ×2 IMPLANT
GLOVE BIOGEL PI IND STRL 8 (GLOVE) ×2 IMPLANT
GLOVE BIOGEL PI INDICATOR 8 (GLOVE) ×4
GLOVE ECLIPSE 8.0 STRL XLNG CF (GLOVE) ×3 IMPLANT
GLOVE ORTHO TXT STRL SZ7.5 (GLOVE) ×6 IMPLANT
GOWN STRL REUS W/ TWL LRG LVL3 (GOWN DISPOSABLE) ×2 IMPLANT
GOWN STRL REUS W/ TWL XL LVL3 (GOWN DISPOSABLE) ×2 IMPLANT
GOWN STRL REUS W/TWL LRG LVL3 (GOWN DISPOSABLE) ×4
GOWN STRL REUS W/TWL XL LVL3 (GOWN DISPOSABLE) ×4
HANDPIECE INTERPULSE COAX TIP (DISPOSABLE) ×2
HEAD M SROM 36MM 2 (Hips) IMPLANT
KIT BASIN OR (CUSTOM PROCEDURE TRAY) ×3 IMPLANT
KIT TURNOVER KIT B (KITS) ×3 IMPLANT
LINER NEUTRAL 36ID 54OD (Liner) ×2 IMPLANT
MANIFOLD NEPTUNE II (INSTRUMENTS) ×3 IMPLANT
NS IRRIG 1000ML POUR BTL (IV SOLUTION) ×3 IMPLANT
PACK TOTAL JOINT (CUSTOM PROCEDURE TRAY) ×3 IMPLANT
PAD ARMBOARD 7.5X6 YLW CONV (MISCELLANEOUS) ×3 IMPLANT
SET HNDPC FAN SPRY TIP SCT (DISPOSABLE) ×1 IMPLANT
SROM M HEAD 36MM 2 (Hips) ×3 IMPLANT
STAPLER VISISTAT 35W (STAPLE) IMPLANT
STEM FEM ACTIS HIGH SZ7 (Stem) ×2 IMPLANT
STRIP CLOSURE SKIN 1/2X4 (GAUZE/BANDAGES/DRESSINGS) ×2 IMPLANT
SUT ETHIBOND NAB CT1 #1 30IN (SUTURE) ×3 IMPLANT
SUT MNCRL AB 3-0 PS2 18 (SUTURE) ×2 IMPLANT
SUT MNCRL AB 4-0 PS2 18 (SUTURE) IMPLANT
SUT VIC AB 0 CT1 27 (SUTURE) ×2
SUT VIC AB 0 CT1 27XBRD ANBCTR (SUTURE) ×1 IMPLANT
SUT VIC AB 1 CT1 27 (SUTURE) ×2
SUT VIC AB 1 CT1 27XBRD ANBCTR (SUTURE) ×1 IMPLANT
SUT VIC AB 2-0 CT1 27 (SUTURE) ×2
SUT VIC AB 2-0 CT1 TAPERPNT 27 (SUTURE) ×1 IMPLANT
TOWEL GREEN STERILE (TOWEL DISPOSABLE) ×3 IMPLANT
TOWEL GREEN STERILE FF (TOWEL DISPOSABLE) ×3 IMPLANT
TRAY CATH 16FR W/PLASTIC CATH (SET/KITS/TRAYS/PACK) IMPLANT
TRAY FOLEY W/BAG SLVR 16FR (SET/KITS/TRAYS/PACK)
TRAY FOLEY W/BAG SLVR 16FR ST (SET/KITS/TRAYS/PACK) IMPLANT
WATER STERILE IRR 1000ML POUR (IV SOLUTION) ×6 IMPLANT

## 2018-10-13 NOTE — Anesthesia Procedure Notes (Signed)
Spinal  Patient location during procedure: OR Start time: 10/13/2018 12:23 PM End time: 10/13/2018 12:37 PM Preanesthetic Checklist Completed: patient identified, site marked, surgical consent, pre-op evaluation, timeout performed, IV checked, risks and benefits discussed and monitors and equipment checked Spinal Block Patient position: sitting Prep: DuraPrep Patient monitoring: heart rate, cardiac monitor, continuous pulse ox and blood pressure Approach: midline Location: L3-4 Injection technique: single-shot Needle Needle type: Sprotte  Needle gauge: 24 G Needle length: 9 cm Assessment Sensory level: T4

## 2018-10-13 NOTE — Anesthesia Postprocedure Evaluation (Signed)
Anesthesia Post Note  Patient: [name]  Procedure(s) Performed: RIGHT TOTAL HIP ARTHROPLASTY ANTERIOR APPROACH (Right Hip)     Patient location during evaluation: PACU Anesthesia Type: Spinal and MAC Level of consciousness: awake and alert Pain management: pain level controlled Vital Signs Assessment: post-procedure vital signs reviewed and stable Respiratory status: spontaneous breathing, nonlabored ventilation and respiratory function stable Cardiovascular status: blood pressure returned to baseline and stable Postop Assessment: no apparent nausea or vomiting Anesthetic complications: no    Last Vitals:  Vitals:   10/13/18 1445 10/13/18 1507  BP:  (!) 192/84  Pulse: (!) 40 (!) 40  Resp: 12 16  Temp:  (!) 36.3 C  SpO2:  100%    Last Pain:  Vitals:   10/13/18 1507  TempSrc: Oral  PainSc:                  Pervis Hocking

## 2018-10-13 NOTE — Transfer of Care (Signed)
Immediate Anesthesia Transfer of Care Note  Patient: [name]  Procedure(s) Performed: RIGHT TOTAL HIP ARTHROPLASTY ANTERIOR APPROACH (Right Hip)  Patient Location: PACU  Anesthesia Type:MAC combined with regional for post-op pain  Level of Consciousness: drowsy and patient cooperative  Airway & Oxygen Therapy: Patient Spontanous Breathing  Post-op Assessment: Report given to RN and Post -op Vital signs reviewed and stable  Post vital signs: Reviewed and stable  Last Vitals:  Vitals Value Taken Time  BP 153/72 10/13/18 1404  Temp    Pulse 46 10/13/18 1404  Resp 18 10/13/18 1404  SpO2 100 % 10/13/18 1404  Vitals shown include unvalidated device data.  Last Pain:  Vitals:   10/13/18 0907  PainSc: 0-No pain      Patients Stated Pain Goal: 3 (66/29/47 6546)  Complications: No apparent anesthesia complications

## 2018-10-13 NOTE — Evaluation (Signed)
Physical Therapy Evaluation Patient Details Name: [name] MRN: [id number] DOB: 1945-08-02 Today's Date: 10/13/2018   History of Present Illness  Pt is a 73 y.o. male s/p elective R THA 10/13/18. PMH includes HTN, prostate CA.  Clinical Impression  Pt presents with an overall decrease in functional mobility secondary to above. PTA, pt independent, enjoys running 5K races and lives alone; reports son available to check on as needed. Educ on precautions, positioning, therex, and importance of mobility. Today, pt limited by significant pain, only able to tolerate taking steps to recliner with RW and minA. Expect pt to progress well once pain controlled. Pt would benefit from continued acute PT services to maximize functional mobility and independence prior to d/c with HHPT services.     Follow Up Recommendations Follow surgeon's recommendation for DC plan and follow-up therapies;Home health PT    Equipment Recommendations  Rolling walker with 5" wheels;3in1 (PT)    Recommendations for Other Services       Precautions / Restrictions Precautions Precautions: Fall Restrictions Weight Bearing Restrictions: Yes RUE Weight Bearing: Weight bearing as tolerated      Mobility  Bed Mobility Overal bed mobility: Needs Assistance Bed Mobility: Supine to Sit     Supine to sit: Min assist     General bed mobility comments: MinA for RLE management; heavy reliance on UE support. Limited by pain  Transfers Overall transfer level: Needs assistance Equipment used: Rolling walker (2 wheeled) Transfers: Sit to/from Stand Sit to Stand: Min assist         General transfer comment: MinA to steady RW and maintain balance, heavy reliance on UE support; limited by pain  Ambulation/Gait Ambulation/Gait assistance: Min guard Gait Distance (Feet): 2 Feet Assistive device: Rolling walker (2 wheeled) Gait Pattern/deviations: Step-to pattern;Decreased weight shift to right;Antalgic     General  Gait Details: Slow, antalgic steps from bed to recliner with RW and min guard. Pt able to tolerate minimal WB through RLE, mainly using BUE support to hop on LLE. Limited by pain  Stairs            Wheelchair Mobility    Modified Rankin (Stroke Patients Only)       Balance Overall balance assessment: Needs assistance   Sitting balance-Leahy Scale: Fair       Standing balance-Leahy Scale: Poor                               Pertinent Vitals/Pain Pain Assessment: Faces Faces Pain Scale: Hurts whole lot Pain Location: RLE Pain Descriptors / Indicators: Guarding;Grimacing;Cramping Pain Intervention(s): Limited activity within patient's tolerance;Monitored during session;RN gave pain meds during session    [place] expects to be discharged to:: Private residence Living Arrangements: Alone Available Help at Discharge: Family;Available PRN/intermittently Type of Home: House Home Access: Stairs to enter Entrance Stairs-Rails: Right Entrance Stairs-Number of Steps: 4 Home Layout: One level Home Equipment: None Additional Comments: Son works as Pharmacist, hospital, able to check on pt/assist as needed    Prior Function Level of Independence: Independent         Comments: Enjoys running 5K races, drives     Hand Dominance        Extremity/Trunk Assessment   Upper Extremity Assessment Upper Extremity Assessment: Overall WFL for tasks assessed    Lower Extremity Assessment Lower Extremity Assessment: RLE deficits/detail RLE Deficits / Details: s/p R THA; knee flex/ext at least 3/5, hip flex limited by pain  Communication   Communication: No difficulties  Cognition Arousal/Alertness: Awake/alert Behavior During Therapy: WFL for tasks assessed/performed Overall Cognitive Status: Within Functional Limits for tasks assessed                                        General Comments General comments (skin integrity,  edema, etc.): Resting HR 40 bpm, up to 49 with mobility/pain; pt reports he is a runner but unsure of resting HR. RN notified of potential for pt to have low resting HR    Exercises     Assessment/Plan    PT Assessment Patient needs continued PT services  PT Problem List Decreased strength;Decreased range of motion;Decreased activity tolerance;Decreased balance;Decreased mobility;Decreased knowledge of use of DME;Decreased knowledge of precautions;Pain       PT Treatment Interventions DME instruction;Gait training;Stair training;Functional mobility training;Therapeutic activities;Therapeutic exercise;Balance training;Patient/family education    PT Goals (Current goals can be found in the Care Plan section)  Acute Rehab PT Goals Patient Stated Goal: Decreased pain PT Goal Formulation: With patient Time For Goal Achievement: 10/27/18 Potential to Achieve Goals: Good    Frequency 7X/week   Barriers to discharge Decreased caregiver support      Co-evaluation               AM-PAC PT "6 Clicks" Mobility  Outcome Measure Help needed turning from your back to your side while in a flat bed without using bedrails?: A Little Help needed moving from lying on your back to sitting on the side of a flat bed without using bedrails?: A Little Help needed moving to and from a bed to a chair (including a wheelchair)?: A Little Help needed standing up from a chair using your arms (e.g., wheelchair or bedside chair)?: A Little Help needed to walk in hospital room?: A Little Help needed climbing 3-5 steps with a railing? : A Lot 6 Click Score: 17    End of Session   Activity Tolerance: Patient limited by pain Patient left: in chair;with call bell/phone within reach;with chair alarm set Nurse Communication: Mobility status PT Visit Diagnosis: Other abnormalities of gait and mobility (R26.89);Pain Pain - Right/Left: Right Pain - part of body: Hip    Time: 1540-1600 PT Time Calculation  (min) (ACUTE ONLY): 20 min   Charges:   PT Evaluation $PT Eval Moderate Complexity: [place], PT, DPT Acute Rehabilitation Services  Pager [phone] Office [phone]  Derry Lory 10/13/2018, 4:42 PM

## 2018-10-13 NOTE — Care Management (Signed)
CM consult acknowledged to assist with any HH/DME needs. Patient was prearranged with [facility] for [facility] needs.Awaiting PT/OT eval for DCP recommendations and will continue to follow.    Midge Minium RN, BSN, NCM-BC, ACM-RN [phone]

## 2018-10-13 NOTE — H&P (Signed)
TOTAL HIP ADMISSION H&P  Patient is admitted for right total hip arthroplasty.  Subjective:  Chief Complaint: right hip pain  HPI: [name], 73 y.o. male, has a history of pain and functional disability in the right hip(s) due to arthritis and patient has failed non-surgical conservative treatments for greater than 12 weeks to include NSAID's and/or analgesics, corticosteriod injections and activity modification.  Onset of symptoms was gradual starting 5 years ago with gradually worsening course since that time.The patient noted no past surgery on the right hip(s).  Patient currently rates pain in the right hip at 10 out of 10 with activity. Patient has night pain, worsening of pain with activity and weight bearing, trendelenberg gait, pain that interfers with activities of daily living, pain with passive range of motion and crepitus. Patient has evidence of subchondral cysts, subchondral sclerosis, periarticular osteophytes and joint space narrowing by imaging studies. This condition presents safety issues increasing the risk of falls.  There is no current active infection.  Patient Active Problem List   Diagnosis Date Noted  . Unilateral primary osteoarthritis, left hip 04/01/2018  . Unilateral primary osteoarthritis, right hip 04/01/2018   Past Medical History:  Diagnosis Date  . Arthritis   . Cancer ([place])    prostrate cancer  . Complication of anesthesia   . Pneumonia    as a child    Past Surgical History:  Procedure Laterality Date  . HERNIA REPAIR     right inguinal     Current Facility-Administered Medications  Medication Dose Route Frequency Provider Last Rate Last Dose  . ceFAZolin (ANCEF) IVPB 2g/100 mL premix  2 g Intravenous On Call to OR Pete Pelt, PA-C      . chlorhexidine (HIBICLENS) 4 % liquid 4 application  60 mL Topical Once Pete Pelt, PA-C      . lactated ringers infusion   Intravenous Continuous Roberts Gaudy, MD 10 mL/hr at 10/13/18 [phone]    .  tranexamic acid (CYKLOKAPRON) 1000MG /139mL IVPB           . tranexamic acid (CYKLOKAPRON) IVPB 1,000 mg  1,000 mg Intravenous To OR Mcarthur Rossetti, MD       No Known Allergies  Social History   Tobacco Use  . Smoking status: Former Smoker    Types: Cigarettes    Quit date: 1965    Years since quitting: 55.7  . Smokeless tobacco: Never Used  Substance Use Topics  . Alcohol use: Yes    Comment: occasional    History reviewed. No pertinent family history.   Review of Systems  Musculoskeletal: Positive for joint pain.  All other systems reviewed and are negative.   Objective:  Physical Exam  Constitutional: He is oriented to person, place, and time. He appears well-developed and well-nourished.  HENT:  Head: Normocephalic and atraumatic.  Eyes: Pupils are equal, round, and reactive to light. EOM are normal.  Neck: Normal range of motion. Neck supple.  Cardiovascular: Normal rate.  Respiratory: Effort normal.  GI: Soft.  Musculoskeletal:     Right hip: He exhibits decreased range of motion, decreased strength, tenderness and bony tenderness.  Neurological: He is alert and oriented to person, place, and time.  Skin: Skin is warm and dry.  Psychiatric: He has a normal mood and affect.    Vital signs in last 24 hours: Temp:  [98.5 F (36.9 C)] 98.5 F (36.9 C) (09/15 0857) Pulse Rate:  [56] 56 (09/15 0857) BP: (169)/(79) 169/79 (09/15 0857) SpO2:  [  98 %] 98 % (09/15 0857)  Labs:   Estimated body mass index is 25.21 kg/m as calculated from the following:   Height as of 10/08/18: 5\' 5"  (1.651 m).   Weight as of 10/08/18: 68.7 kg.   Imaging Review Plain radiographs demonstrate severe degenerative joint disease of the right hip(s). The bone quality appears to be good for age and reported activity level.      Assessment/Plan:  End stage arthritis, right hip(s)  The patient history, physical examination, clinical judgement of the provider and imaging  studies are consistent with end stage degenerative joint disease of the right hip(s) and total hip arthroplasty is deemed medically necessary. The treatment options including medical management, injection therapy, arthroscopy and arthroplasty were discussed at length. The risks and benefits of total hip arthroplasty were presented and reviewed. The risks due to aseptic loosening, infection, stiffness, dislocation/subluxation,  thromboembolic complications and other imponderables were discussed.  The patient acknowledged the explanation, agreed to proceed with the plan and consent was signed. Patient is being admitted for inpatient treatment for surgery, pain control, PT, OT, prophylactic antibiotics, VTE prophylaxis, progressive ambulation and ADL's and discharge planning.The patient is planning to be discharged home with home health services    Patient's anticipated LOS is less than 2 midnights, meeting these requirements: - Younger than 62 - Lives within 1 hour of care - Has a competent adult at home to recover with post-op recover - NO history of  - Chronic pain requiring opiods  - Diabetes  - Coronary Artery Disease  - Heart failure  - Heart attack  - Stroke  - DVT/VTE  - Cardiac arrhythmia  - Respiratory Failure/COPD  - Renal failure  - Anemia  - Advanced Liver disease

## 2018-10-13 NOTE — Brief Op Note (Signed)
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   Delay start of Pharmacological VTE agent (>24hrs) due to surgical blood loss or risk of bleeding: no

## 2018-10-13 NOTE — Anesthesia Preprocedure Evaluation (Addendum)
Anesthesia Evaluation  Patient identified by MRN, date of birth, ID band Patient awake    Reviewed: Allergy & Precautions, NPO status , Patient's Chart, lab work & pertinent test results  Airway Mallampati: II  TM Distance: >3 FB Neck ROM: Full    Dental no notable dental hx. (+) Poor Dentition,    Pulmonary neg pulmonary ROS, former smoker,  Quit smoking 1965   Pulmonary exam normal breath sounds clear to auscultation       Cardiovascular negative cardio ROS Normal cardiovascular exam Rhythm:Regular Rate:Normal     Neuro/Psych negative neurological ROS  negative psych ROS   GI/Hepatic negative GI ROS, Neg liver ROS,   Endo/Other  negative endocrine ROS  Renal/GU negative Renal ROS  negative genitourinary   Musculoskeletal  (+) Arthritis , Osteoarthritis,    Abdominal   Peds negative pediatric ROS (+)  Hematology negative hematology ROS (+)   Anesthesia Other Findings   Reproductive/Obstetrics negative OB ROS                            Anesthesia Physical Anesthesia Plan  ASA: II  Anesthesia Plan: Spinal   Post-op Pain Management:    Induction:   PONV Risk Score and Plan: 2 and Propofol infusion and TIVA  Airway Management Planned: Natural Airway and Nasal Cannula  Additional Equipment: None  Intra-op Plan:   Post-operative Plan:   Informed Consent: I have reviewed the patients History and Physical, chart, labs and discussed the procedure including the risks, benefits and alternatives for the proposed anesthesia with the patient or authorized representative who has indicated his/her understanding and acceptance.       Plan Discussed with: CRNA  Anesthesia Plan Comments:         Anesthesia Quick Evaluation

## 2018-10-14 ENCOUNTER — Encounter (HOSPITAL_COMMUNITY): Payer: Self-pay | Admitting: Orthopaedic Surgery

## 2018-10-14 DIAGNOSIS — M1611 Unilateral primary osteoarthritis, right hip: Secondary | ICD-10-CM | POA: Diagnosis not present

## 2018-10-14 LAB — CBC
HCT: 32.3 % — ABNORMAL LOW (ref 39.0–52.0)
Hemoglobin: 10.9 g/dL — ABNORMAL LOW (ref 13.0–17.0)
MCH: 31.6 pg (ref 26.0–34.0)
MCHC: 33.7 g/dL (ref 30.0–36.0)
MCV: 93.6 fL (ref 80.0–100.0)
Platelets: 174 10*3/uL (ref 150–400)
RBC: 3.45 MIL/uL — ABNORMAL LOW (ref 4.22–5.81)
RDW: 13.1 % (ref 11.5–15.5)
WBC: 5.5 10*3/uL (ref 4.0–10.5)
nRBC: 0 % (ref 0.0–0.2)

## 2018-10-14 LAB — BASIC METABOLIC PANEL
Anion gap: 10 (ref 5–15)
BUN: 10 mg/dL (ref 8–23)
CO2: 21 mmol/L — ABNORMAL LOW (ref 22–32)
Calcium: 8.8 mg/dL — ABNORMAL LOW (ref 8.9–10.3)
Chloride: 107 mmol/L (ref 98–111)
Creatinine, Ser: 1.13 mg/dL (ref 0.61–1.24)
GFR calc Af Amer: >60 mL/min (ref >60)
GFR calc non Af Amer: >60 mL/min (ref >60)
Glucose, Bld: 118 mg/dL — ABNORMAL HIGH (ref 70–99)
Potassium: 3.9 mmol/L (ref 3.5–5.1)
Sodium: 138 mmol/L (ref 135–145)

## 2018-10-14 MED ORDER — ASPIRIN 81 MG PO CHEW: 81.0000 mg | CHEWABLE_TABLET | Freq: Two times a day (BID) | ORAL | 0 refills | Status: DC

## 2018-10-14 MED ORDER — TIZANIDINE HCL 4 MG PO TABS: 4.0000 mg | ORAL_TABLET | Freq: Three times a day (TID) | ORAL | 0 refills | Status: DC | PRN

## 2018-10-14 MED ORDER — OXYCODONE HCL 5 MG PO TABS: 5.0000 mg | ORAL_TABLET | ORAL | 0 refills | Status: DC | PRN

## 2018-10-14 NOTE — Progress Notes (Signed)
Subjective: 1 Day Post-Op Procedure(s) (LRB): RIGHT TOTAL HIP ARTHROPLASTY ANTERIOR APPROACH (Right) Patient reports pain as moderate.  Already up with PT this am and did get one session yesterday late.  Objective: Vital signs in last 24 hours: Temp:  [97.4 F (36.3 C)-100.3 F (37.9 C)] 99.1 F (37.3 C) (09/16 0736) Pulse Rate:  [36-61] 57 (09/16 0736) Resp:  [12-20] 16 (09/16 0736) BP: (131-192)/(63-89) 156/79 (09/16 0736) SpO2:  [97 %-100 %] 98 % (09/16 0736) Weight:  [74 kg] 74 kg (09/15 1935)  Intake/Output from previous day: 09/15 0701 - 09/16 0700 In: 2043.3 [P.O.:240; I.V.:1703.3; IV Piggyback:100] Out: 2050 [Urine:1850; Blood:200] Intake/Output this shift: No intake/output data recorded.  Recent Labs    10/14/18 0343  HGB 10.9*   Recent Labs    10/14/18 0343  WBC 5.5  RBC 3.45*  HCT 32.3*  PLT 174   Recent Labs    10/14/18 0343  NA 138  K 3.9  CL 107  CO2 21*  BUN 10  CREATININE 1.13  GLUCOSE 118*  CALCIUM 8.8*   No results for input(s): LABPT, INR in the last 72 hours.  Sensation intact distally Intact pulses distally Dorsiflexion/Plantar flexion intact Incision: scant drainage   Assessment/Plan: 1 Day Post-Op Procedure(s) (LRB): RIGHT TOTAL HIP ARTHROPLASTY ANTERIOR APPROACH (Right) Up with therapy Discharge home with home health  This afternoon late if continues to do well.   Patient's anticipated LOS is less than 2 midnights, meeting these requirements: - Younger than 45 - Lives within 1 hour of care - Has a competent adult at home to recover with post-op recover - NO history of  - Chronic pain requiring opiods  - Diabetes  - Coronary Artery Disease  - Heart failure  - Heart attack  - Stroke  - DVT/VTE  - Cardiac arrhythmia  - Respiratory Failure/COPD  - Renal failure  - Anemia  - Advanced Liver disease       Mcarthur Rossetti 10/14/2018, 8:11 AM

## 2018-10-14 NOTE — TOC Transition Note (Signed)
Transition of Care [facility]) - CM/SW Discharge Note   Patient Details  Name: [name] MRN: [id number] Date of Birth: October 21, 1945  [facility] CM/SW Contact:  Midge Minium RN, BSN, NCM-BC, ACM-RN [phone] Phone Number: 10/14/2018, 10:06 AM   Clinical Narrative:    CM following for dispositional needs; spoke to patient to discuss the POC. Patient states living at home alone and being independent with his ADLs PTA. PCP verified as: Dr. Philip Aspen; Demographics verified. Patient is s/p R THA; PT eval completed with HHPT/BSC/FWW recommended with patient agreeable. HHPT was arranged pre-admission with [facility], with patient agreeable to the referral. CM updated Tiffany RN [facility] liaison) of the patients DC for today. DME preference discussed with [employer] selected. DME referral given to Learta Codding [facility] liaison); AVS updated. Patient indicated his son will provide transportation home and assistance in the afternoon, and be available to check on patient to assist as needed. No further needs from CM.  Final next level of care: [place] Barriers to Discharge: No Barriers Identified   Patient Goals and CMS Choice Patient states their goals for this hospitalization and ongoing recovery are:: "to recover at home" CMS Medicare.gov Compare Post Acute Care list provided to:: Patient Choice offered to / list presented to : Patient   Discharge Plan and Services          DME Arranged: Bedside commode, Walker rolling DME Agency: [place] Date DME Agency Contacted: 10/14/18 Time DME Agency Contacted: 1005 Representative spoke with at DME Agency: Learta Codding [facility] liaison) [place] Arranged: PT [place]: [facility] [employer] Date [place]: 10/14/18 Time [place]: 1005 Representative spoke with at [place]: [place] (liaison)  Social Determinants of Health ([place]) Interventions     Readmission Risk  Interventions No flowsheet data found.

## 2018-10-14 NOTE — Progress Notes (Signed)
Pt noted to have decreased appetite this am. Then had a spell of nausea with vomiting. Pt was given some zofran thru his IV. No further c/o nausea. Pt has been tolerating clear liquids and crackers well.

## 2018-10-14 NOTE — Progress Notes (Signed)
Physical Therapy Treatment Patient Details Name: [name] MRN: [id number] DOB: Feb 05, 1945 Today's Date: 10/14/2018    History of Present Illness Pt is a 73 y.o. male s/p elective R THA 10/13/18. PMH includes HTN, prostate CA.    PT Comments    Pt tolerated treatment well, despite significant reports of RLE pain during mobility. Pt improved transfer and ambulation quality with increased WB through RLE and decreased PT assistance requirements. Pt will benefit from increasing ambulation tolerance, stair training, and transfer/bed mobility training during next session to continue progression toward PT goals. PT recommending discharge home with home health PT, RW, and a 3 in 1 bedside commode.    Follow Up Recommendations  Follow surgeon's recommendation for DC plan and follow-up therapies;Home health PT     Equipment Recommendations  Rolling walker with 5" wheels;3in1 (PT)    Recommendations for Other Services       Precautions / Restrictions Precautions Precautions: Fall Restrictions Weight Bearing Restrictions: Yes RLE Weight Bearing: Weight bearing as tolerated    Mobility  Bed Mobility   Bed Mobility: Supine to Sit     Supine to sit: Min guard     General bed mobility comments: Min Guard for RLE initially  Transfers   Equipment used: Rolling walker (2 wheeled) Transfers: Sit to/from Stand Sit to Stand: Min guard         General transfer comment: Min Guard to steady as well as verbal cues for proper device management and hand placement during transfer  Ambulation/Gait Ambulation/Gait assistance: Supervision Gait Distance (Feet): 150 Feet Assistive device: Rolling walker (2 wheeled) Gait Pattern/deviations: Step-to pattern(intermittent use of hop to pattern 2/2 pain) Gait velocity: Decreased   General Gait Details: PT providing verbal cues for increased WB through RLE during stance phase, reduced gait speed with shortened step length   Stairs              Wheelchair Mobility    Modified Rankin (Stroke Patients Only)       Balance     Sitting balance-Leahy Scale: Fair(Unilateral UE support of bed)       Standing balance-Leahy Scale: Fair                              Cognition Arousal/Alertness: Awake/alert Behavior During Therapy: WFL for tasks assessed/performed Overall Cognitive Status: Within Functional Limits for tasks assessed                                        Exercises      General Comments General comments (skin integrity, edema, etc.): Resting HR up in 60's this session      Pertinent Vitals/Pain Pain Assessment: 0-10 Pain Score: 7  Pain Location: RLE Pain Descriptors / Indicators: Guarding;Grimacing;Cramping Pain Intervention(s): Patient requesting pain meds-RN notified    Home Living                      Prior Function            PT Goals (current goals can now be found in the care plan section) Acute Rehab PT Goals Patient Stated Goal: Decreased pain Progress towards PT goals: Progressing toward goals    Frequency    7X/week      PT Plan Current plan remains appropriate    Co-evaluation  AM-PAC PT "6 Clicks" Mobility   Outcome Measure  Help needed turning from your back to your side while in a flat bed without using bedrails?: A Little Help needed moving from lying on your back to sitting on the side of a flat bed without using bedrails?: A Little Help needed moving to and from a bed to a chair (including a wheelchair)?: A Little Help needed standing up from a chair using your arms (e.g., wheelchair or bedside chair)?: A Little Help needed to walk in hospital room?: None Help needed climbing 3-5 steps with a railing? : A Little 6 Click Score: 19    End of Session   Activity Tolerance: Patient limited by pain Patient left: in chair;with SCD's reapplied Nurse Communication: Mobility status;Patient requests pain  meds PT Visit Diagnosis: Other abnormalities of gait and mobility (R26.89);Pain Pain - Right/Left: Right Pain - part of body: Hip     Time: 0801-0820 PT Time Calculation (min) (ACUTE ONLY): 19 min  Charges:  $Gait Training: 8-22 mins                     Mabeline Caras, PT, DPT Acute Rehabilitation Services  Pager [phone] Office [place] 10/14/2018, 8:35 AM

## 2018-10-14 NOTE — Evaluation (Signed)
Occupational Therapy Evaluation Patient Details Name: [name] MRN: [id number] DOB: [birth year] Today's Date: 10/14/2018    History of Present Illness Pt is a 73 y.o. male s/p elective R THA 10/13/18. PMH includes HTN, prostate CA.   Clinical Impression   Pt is typically an active, independent man who lives independently. Presents with R hip pain and nausea and vomiting limiting session. Pt requires min guard assist with RW for OOB mobility and up to min assist for ADL. Will return to educate in use of AE and for ADL training.    Follow Up Recommendations  No OT follow up    Equipment Recommendations  3 in 1 bedside commode    Recommendations for Other Services       Precautions / Restrictions Precautions Precautions: Fall Restrictions Weight Bearing Restrictions: Yes RLE Weight Bearing: Weight bearing as tolerated      Mobility Bed Mobility        General bed mobility comments: pt received in chair  Transfers Overall transfer level: Needs assistance Equipment used: Rolling walker (2 wheeled) Transfers: Sit to/from Stand Sit to Stand: Min guard         General transfer comment: cues for technique    Balance Overall balance assessment: Needs assistance   Sitting balance-Leahy Scale: Good       Standing balance-Leahy Scale: Fair Standing balance comment: can release walker in static standing                           ADL either performed or assessed with clinical judgement   ADL Overall ADL's : Needs assistance/impaired Eating/Feeding: Independent;Sitting   Grooming: Wash/dry hands;Wash/dry face;Sitting;Set up   Upper Body Bathing: Set up;Sitting   Lower Body Bathing: Minimal assistance;Sit to/from stand   Upper Body Dressing : Set up;Sitting   Lower Body Dressing: Minimal assistance;Sit to/from stand   Toilet Transfer: Min guard;Ambulation;RW   Toileting- Water quality scientist and Hygiene: Min guard;Sit to/from stand               Vision Patient Visual Report: No change from baseline       Perception     Praxis      Pertinent Vitals/Pain Pain Assessment: Faces Pain Score: 7  Faces Pain Scale: Hurts whole lot Pain Location: RLE Pain Descriptors / Indicators: Guarding;Grimacing;Cramping Pain Intervention(s): Premedicated before session;Monitored during session;Ice applied     Hand Dominance Right   Extremity/Trunk Assessment Upper Extremity Assessment Upper Extremity Assessment: Overall WFL for tasks assessed   Lower Extremity Assessment Lower Extremity Assessment: Defer to PT evaluation   Cervical / Trunk Assessment Cervical / Trunk Assessment: Normal   Communication Communication Communication: No difficulties   Cognition Arousal/Alertness: Awake/alert Behavior During Therapy: WFL for tasks assessed/performed Overall Cognitive Status: Within Functional Limits for tasks assessed                                     General Comments      Exercises     Shoulder Instructions      Home Living Family/patient expects to be discharged to:: Private residence Living Arrangements: Alone Available Help at Discharge: Family;Available PRN/intermittently Type of Home: House Home Access: Stairs to enter [employer] of Steps: 4 Entrance Stairs-Rails: Right Home Layout: One level     Bathroom Shower/Tub: Tub/shower unit;Walk-in shower   Bathroom Toilet: Standard     Home Equipment: None  Additional Comments: Son works as Pharmacist, hospital, able to check on pt/assist as needed      Prior Functioning/Environment Level of Independence: Independent        Comments: Enjoys running 5K races, drives        OT Problem List: Decreased strength;Decreased activity tolerance;Impaired balance (sitting and/or standing);Decreased knowledge of use of DME or AE;Pain      OT Treatment/Interventions: Self-care/ADL training;DME and/or AE instruction;Patient/family  education;Balance training;Therapeutic activities    OT Goals(Current goals can be found in the care plan section) Acute Rehab OT Goals Patient Stated Goal: Decreased pain OT Goal Formulation: With patient Time For Goal Achievement: 10/28/18 Potential to Achieve Goals: Good ADL Goals Pt Will Perform Grooming: with modified independence;standing Pt Will Perform Lower Body Bathing: with modified independence;sit to/from stand Pt Will Perform Lower Body Dressing: with modified independence;with adaptive equipment;sit to/from stand Pt Will Transfer to Toilet: with modified independence;ambulating;bedside commode(over toilet) Pt Will Perform Toileting - Clothing Manipulation and hygiene: with modified independence;sit to/from stand Pt Will Perform Tub/Shower Transfer: Shower transfer;with supervision;3 in 1;rolling walker;ambulating  OT Frequency: Min 2X/week   Barriers to D/C:            Co-evaluation              AM-PAC OT "6 Clicks" Daily Activity     Outcome Measure Help from another person eating meals?: None Help from another person taking care of personal grooming?: A Little Help from another person toileting, which includes using toliet, bedpan, or urinal?: A Little Help from another person bathing (including washing, rinsing, drying)?: A Little Help from another person to put on and taking off regular upper body clothing?: None Help from another person to put on and taking off regular lower body clothing?: A Little 6 Click Score: 20   End of Session Equipment Utilized During Treatment: Gait belt;Rolling walker Nurse Communication: Other (comment)(aware pt vomited, gave meds)  Activity Tolerance: Treatment limited secondary to medical complications (Comment)(vomited) Patient left: in chair;with call bell/phone within reach;with nursing/sitter in room  OT Visit Diagnosis: Unsteadiness on feet (R26.81);Other abnormalities of gait and mobility (R26.89);Pain Pain -  Right/Left: Right Pain - part of body: Hip                Time: [id number] OT Time Calculation (min): 16 min Charges:  OT General Charges $OT Visit: 1 Visit OT Evaluation $OT Eval Moderate Complexity: 1 Mod  Nestor Lewandowsky, OTR/L Acute Rehabilitation Services Pager: [phone] Office: [phone]  Malka So 10/14/2018, 11:17 AM

## 2018-10-14 NOTE — Progress Notes (Signed)
   10/14/18 1142  AVS Discharge Documentation  AVS Discharge Instructions Including Medications Provided to patient/caregiver  Name of Person Receiving AVS Discharge Instructions Including Medications [name]  Name of Clinician That Reviewed AVS Discharge Instructions Including Medications Dineen Kid, RN

## 2018-10-14 NOTE — Op Note (Signed)
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  PROCEDURE:  Right total hip arthroplasty, direct anterior approach.  IMPLANTS:  DePuy Sector Gription acetabular component size 54, size 36+0 neutral polyethylene liner, size 36-2 metal hip ball, size 7 high offset Actis femoral component.  SURGEON:  Jonn Shingles, MD  ASSISTANT:  Erskine Emery, PA-C  ANESTHESIA:  Spinal.  ANTIBIOTICS:  Two grams IV Ancef.  ESTIMATED BLOOD LOSS:  200 mL.  COMPLICATIONS:  None.  INDICATIONS:  The patient is a pleasant 73 year old runner who has debilitating arthritis involving both his hips.  Both hips show end-stage arthritis.  His right hip hurts him much worse than his left.  At this point, with the failure of conservative  treatment, he does wish to proceed with a total hip arthroplasty on the right side.  His pain is daily and it is detrimentally affecting his mobility, his quality of life and his activities of daily living.  He understands with surgery, there is a risk  of acute blood loss anemia, nerve and vessel injury, fracture, infection, dislocation, DVT and implant failure.  He understands our goals are to decrease pain, improve mobility and overall improve quality of life.  DESCRIPTION OF PROCEDURE:  After informed consent was obtained and appropriate right hip was marked.  He was brought to the operating room and sat up on a stretcher where spinal anesthesia was then obtained.  He was then laid in supine position.  We  assessed his leg length and placed traction boots on both his feet.  A Foley catheter was placed as  well.  Next, he was placed supine on the Hana fracture table, the perineal post in place and both legs in line skeletal traction device and no traction  applied.  His right operative hip was prepped and draped with DuraPrep and sterile drapes.  A time-out was called to identify correct patient and correct right hip.  We then made an incision just inferior and posterior to the anterior superior iliac  spine and carried this obliquely down the leg.  We dissected down tensor fascia lata muscle and the tensor fascia was then divided longitudinally to proceed with direct anterior approach to the hip.  We identified and cauterized circumflex vessels.  I  then identified the hip capsule.  We elevated the hip capsule in an L-type format exposing the hip joint itself.  We placed curved retractors around the medial and lateral femoral neck and then made our femoral neck cut with an oscillating saw just  proximal to the lesser trochanter and completed this with an osteotome.  We placed a corkscrew guide in the femoral head and removed the femoral head in its entirety and found a wide area devoid of cartilage.  I then placed a bent Hohmann over the medial  acetabular rim and removed periarticular osteophytes and other remnants of the acetabular labrum and other debris.  We then began reaming under direct visualization from a size 44 reamer in stepwise increments up to a size 53 with all reamers under  direct visualization, the last reamer under direct fluoroscopy, so we could obtain our depth of reaming, our inclination and anteversion.  We then placed the real DePuy Sector Gription acetabular component  size 54 and a 36+0 neutral polyethylene liner  for that size acetabular component.  Attention was then turned to the femur.  With the leg externally rotated to 120 degrees, extended and adducted, we were able to place the Mueller retractor medially and Hohman retractor behind the greater trochanter.   We released lateral  joint capsule and used a box-cutting osteotome to enter the femoral canal and a rongeur to lateralize and then began broaching using the Actis broaching system from a size zero going up to a size 7.  With a size 7 in place, we tried  a high offset femoral neck and went with a 36-2 hip ball based on our higher neck cut.  We reduced this in the acetabulum.  We were pleased with the leg length, offset, range of motion and stability assessed clinically and radiographically.  We then  dislocated the hip and removed the trial components.  We placed the real Actis high offset femoral component, size 7 and the real size 36-2 metal hip ball, reduced this in the acetabulum and again we were pleased with stability.  We then irrigated the  soft tissue with normal saline solution using pulsatile lavage.  We verified again the placement of the implants with direct fluoroscopy.  We then closed the joint capsule with interrupted #1 Ethibond suture, followed by closing the tensor fascia with #1  Vicryl, 0 Vicryl was used to close deep tissue, 2-0 Vicryl was used to close the subcutaneous tissue, 4-0 Monocryl subcuticular stitch ____.  Steri-Strips were applied on the skin.  Aquacel dressing was applied.  He was taken off the Hana table and  taken to recovery room in stable condition.  All final counts were correct.  There were no complications noted.  Of note, Benita Stabile, PA-C, assisted the entire case.  His assistance was crucial for facilitating all aspects of this case.  TN/NUANCE  D:10/13/2018 T:10/13/2018 JOB:[id number]

## 2018-10-14 NOTE — Progress Notes (Signed)
Pt seen for second visit to instruct in ADL, AE use and tub transfer (verbally and reinforced with handout). Pt verbally and/or demonstrating understanding.   10/14/18 1200  OT Visit Information  Last OT Received On 10/14/18  Assistance Needed +1  History of Present Illness Pt is a 73 y.o. male s/p elective R THA 10/13/18. PMH includes HTN, prostate CA.  Precautions  Precautions Fall  Pain Assessment  Pain Assessment Faces  Faces Pain Scale 6  Pain Location RLE  Pain Descriptors / Indicators Guarding;Grimacing;Cramping  Pain Intervention(s) Monitored during session;Repositioned;Ice applied  Cognition  Arousal/Alertness Awake/alert  Behavior During Therapy WFL for tasks assessed/performed  Overall Cognitive Status Within Functional Limits for tasks assessed  ADL  Lower Body Bathing Cueing for compensatory techniques;With adaptive equipment (educated pt in use of long handled bath sponge and reacher)  Lower Body Dressing Minimal assistance;Sit to/from stand;With adaptive equipment (educated pt in use of reacher, sock aid and long shoehorn)  General ADL Comments Instructed pt in safe footwear and how to transport items safely with RW.  Bed Mobility  Overal bed mobility Needs Assistance  Bed Mobility Supine to Sit;Sit to Supine  Supine to sit Min guard  Sit to supine Min assist  General bed mobility comments min assist for R LE back into bed  Balance  Overall balance assessment Needs assistance  Sitting balance-Leahy Scale Good  Standing balance-Leahy Scale Fair  Restrictions  Weight Bearing Restrictions Yes  RUE Weight Bearing WBAT  RLE Weight Bearing WBAT  Transfers  Overall transfer level Needs assistance  Equipment used Rolling walker (2 wheeled)  Transfers Sit to/from Stand  Sit to Stand Min guard  General transfer comment cues for technique  OT - End of Session  Equipment Utilized During Treatment Gait belt;Rolling walker  Activity Tolerance Patient tolerated treatment  well  Patient left in bed;with call bell/phone within reach  OT Assessment/Plan  OT Plan Discharge plan remains appropriate  OT Visit Diagnosis Unsteadiness on feet (R26.81);Other abnormalities of gait and mobility (R26.89);Pain  Pain - Right/Left Right  Pain - part of body Hip  OT Frequency (ACUTE ONLY) Min 2X/week  Follow Up Recommendations No OT follow up  OT Equipment 3 in 1 bedside commode  AM-PAC OT "6 Clicks" Daily Activity Outcome Measure (Version 2)  Help from another person eating meals? 4  Help from another person taking care of personal grooming? 3  Help from another person toileting, which includes using toliet, bedpan, or urinal? 3  Help from another person bathing (including washing, rinsing, drying)? 3  Help from another person to put on and taking off regular upper body clothing? 4  Help from another person to put on and taking off regular lower body clothing? 3  6 Click Score 20  OT Goal Progression  Progress towards OT goals Progressing toward goals  Acute Rehab OT Goals  Patient Stated Goal Decreased pain  OT Goal Formulation With patient  Time For Goal Achievement 10/28/18  Potential to Achieve Goals Good  OT Time Calculation  OT Start Time (ACUTE ONLY) 1144  OT Stop Time (ACUTE ONLY) 1205  OT Time Calculation (min) 21 min  OT General Charges  $OT Visit 1 Visit  OT Treatments  $Self Care/Home Management  8-22 mins  Nestor Lewandowsky, OTR/L Acute Rehabilitation Services Pager: [phone] Office: [phone]

## 2018-10-14 NOTE — Progress Notes (Signed)
Physical Therapy Treatment Patient Details Name: [name] MRN: [id number] DOB: 03-26-1945 Today's Date: 10/14/2018    History of Present Illness Pt is a 73 y.o. male s/p elective R THA 10/13/18. PMH includes HTN, prostate CA.    PT Comments    Pt tolerated treatment well with improved transfer quality and decreased toe walking of RLE during gait training. Pt able to negotiate stairs with use of railing and no physical assistance requirements. Pt is able to perform all mobility required in the home setting with use of RW and no physical assistance requirements at this time. Pt encouraged to ambulate multiple times per day for the remainder of hospitalization and s/p discharge. Pt will continue to benefit from acute PT services until discharge is complete to address gait deviations and improve upon endurance deficits. Pt will benefit from home health PT, a RW, and a 3 in 1 commode upon discharge (DME already delivered to room).    Follow Up Recommendations  Follow surgeon's recommendation for DC plan and follow-up therapies;Home health PT     Equipment Recommendations  Rolling walker with 5" wheels;3in1 (PT)(already delivered to room)    Recommendations for Other Services       Precautions / Restrictions Precautions Precautions: Fall Restrictions Weight Bearing Restrictions: Yes RUE Weight Bearing: Weight bearing as tolerated RLE Weight Bearing: Weight bearing as tolerated    Mobility  Bed Mobility Overal bed mobility: Needs Assistance Bed Mobility: Supine to Sit;Sit to Supine     Supine to sit: Supervision Sit to supine: Supervision   General bed mobility comments: PT providing verbal cues for patient to utilize LLE to assist RLE back into bed during sit to supine if necessary  Transfers Overall transfer level: Needs assistance Equipment used: Rolling walker (2 wheeled) Transfers: Sit to/from Stand Sit to Stand: Supervision         General transfer comment: cues  for technique  Ambulation/Gait Ambulation/Gait assistance: Supervision Gait Distance (Feet): 150 Feet Assistive device: Rolling walker (2 wheeled) Gait Pattern/deviations: Step-to pattern(pt with decreased heel strink of RLE initially) Gait velocity: Decreased   General Gait Details: PT providing verbal cues for increased WB through RLE as well as promoting RLE heel strike during initial contact. Pt with improved consistency of heel strike opposed to toe strike as gait progresses   Stairs Stairs: Yes Stairs assistance: Supervision Stair Management: One rail Right;Sideways;Step to pattern Number of Stairs: 3     Wheelchair Mobility    Modified Rankin (Stroke Patients Only)       Balance Overall balance assessment: Needs assistance Sitting-balance support: No upper extremity supported;Feet supported Sitting balance-Leahy Scale: Good       Standing balance-Leahy Scale: Fair Standing balance comment: can release walker in static standing                            Cognition Arousal/Alertness: Awake/alert Behavior During Therapy: WFL for tasks assessed/performed Overall Cognitive Status: Within Functional Limits for tasks assessed                                        Exercises      General Comments        Pertinent Vitals/Pain Pain Assessment: No/denies pain Faces Pain Scale: Hurts even more Pain Location: RLE Pain Descriptors / Indicators: Guarding;Grimacing;Cramping Pain Intervention(s): Monitored during session;Repositioned;Ice applied    Home  Living Family/patient expects to be discharged to:: Private residence Living Arrangements: Alone Available Help at Discharge: Family;Available PRN/intermittently Type of Home: House Home Access: Stairs to enter Entrance Stairs-Rails: Right Home Layout: One level Home Equipment: None Additional Comments: Son works as Pharmacist, hospital, able to check on pt/assist as needed    Prior Function  Level of Independence: Independent      Comments: Enjoys running 5K races, drives   PT Goals (current goals can now be found in the care plan section) Acute Rehab PT Goals Patient Stated Goal: To go home Progress towards PT goals: Progressing toward goals    Frequency    7X/week      PT Plan Current plan remains appropriate    Co-evaluation              AM-PAC PT "6 Clicks" Mobility   Outcome Measure  Help needed turning from your back to your side while in a flat bed without using bedrails?: None Help needed moving from lying on your back to sitting on the side of a flat bed without using bedrails?: None Help needed moving to and from a bed to a chair (including a wheelchair)?: None Help needed standing up from a chair using your arms (e.g., wheelchair or bedside chair)?: None Help needed to walk in hospital room?: None Help needed climbing 3-5 steps with a railing? : None 6 Click Score: 24    End of Session   Activity Tolerance: Patient limited by fatigue Patient left: in bed Nurse Communication: Mobility status PT Visit Diagnosis: Other abnormalities of gait and mobility (R26.89);Pain Pain - Right/Left: Right Pain - part of body: Hip     Time: 1220-1237 PT Time Calculation (min) (ACUTE ONLY): 17 min  Charges:  $Gait Training: 8-22 mins                     Mabeline Caras, PT, DPT Acute Rehabilitation Services  Pager [phone] Office [place] 10/14/2018, 12:56 PM

## 2018-10-14 NOTE — Discharge Summary (Signed)
Patient ID: [name] MRN: [id number] DOB/AGE: [birth year] 73 y.o.  Admit date: 10/13/2018 Discharge date: 10/14/2018  Admission Diagnoses:  Principal Problem:   Unilateral primary osteoarthritis, right hip Active Problems:   Status post total replacement of right hip   Discharge Diagnoses:  Same  Past Medical History:  Diagnosis Date  . Arthritis   . Cancer ([place])    prostrate cancer  . Complication of anesthesia   . Pneumonia    as a child    Surgeries: Procedure(s): RIGHT TOTAL HIP ARTHROPLASTY ANTERIOR APPROACH on 10/13/2018   Consultants:   Discharged Condition: Improved  Hospital Course: Jaicion Cahn is an 73 y.o. male who was admitted 10/13/2018 for operative treatment ofUnilateral primary osteoarthritis, right hip. Patient has severe unremitting pain that affects sleep, daily activities, and work/hobbies. After pre-op clearance the patient was taken to the operating room on 10/13/2018 and underwent  Procedure(s): RIGHT TOTAL HIP ARTHROPLASTY ANTERIOR APPROACH.    Patient was given perioperative antibiotics:  Anti-infectives (From admission, onward)   Start     Dose/Rate Route Frequency Ordered Stop   10/13/18 1830  ceFAZolin (ANCEF) IVPB 1 g/50 mL premix     1 g 100 mL/hr over 30 Minutes Intravenous Every 6 hours 10/13/18 1507 10/14/18 0700   10/13/18 0900  ceFAZolin (ANCEF) IVPB 2g/100 mL premix     2 g 200 mL/hr over 30 Minutes Intravenous On call to O.R. 10/13/18 0851 10/13/18 1248       Patient was given sequential compression devices, early ambulation, and chemoprophylaxis to prevent DVT.  Patient benefited maximally from hospital stay and there were no complications.    Recent vital signs:  Patient Vitals for the past 24 hrs:  BP Temp Temp src Pulse Resp SpO2 Height Weight  10/14/18 1146 (!) 182/89 99.9 F (37.7 C) Oral 71 18 96 % - -  10/14/18 0736 (!) 156/79 99.1 F (37.3 C) Oral (!) 57 16 98 % - -  10/14/18 0500 140/70 100.3 F (37.9 C) Oral (!)  57 20 99 % - -  10/14/18 0022 131/63 99.3 F (37.4 C) Oral 61 - 98 % - -  10/13/18 2215 (!) 162/73 - - (!) 56 - - - -  10/13/18 1941 (!) 192/88 98.5 F (36.9 C) Oral (!) 51 14 100 % - -  10/13/18 1935 - - - - - - 5\' 6"  (1.676 m) 74 kg     Recent laboratory studies:  Recent Labs    10/14/18 0343  WBC 5.5  HGB 10.9*  HCT 32.3*  PLT 174  NA 138  K 3.9  CL 107  CO2 21*  BUN 10  CREATININE 1.13  GLUCOSE 118*  CALCIUM 8.8*     Discharge Medications:   Allergies as of 10/14/2018   No Known Allergies     Medication List    TAKE these medications   aspirin 81 MG chewable tablet Chew 1 tablet (81 mg total) by mouth 2 (two) times daily.   oxyCODONE 5 MG immediate release tablet Commonly known as: Oxy IR/ROXICODONE Take 1-2 tablets (5-10 mg total) by mouth every 4 (four) hours as needed for moderate pain (pain score 4-6). Notes to patient: Last given at 821   tiZANidine 4 MG tablet Commonly known as: ZANAFLEX Take 1 tablet (4 mg total) by mouth every 8 (eight) hours as needed.            Durable Medical Equipment  (From admission, onward)  Start     Ordered   10/13/18 1508  DME Walker rolling  Once    Question:  Patient needs a walker to treat with the following condition  Answer:  Status post total replacement of right hip   10/13/18 1507   10/13/18 1508  DME 3 n 1  Once     10/13/18 1507          Diagnostic Studies: Dg Pelvis Portable  Result Date: 10/13/2018 CLINICAL DATA:  Status post right hip replacement. EXAM: PORTABLE PELVIS 1-2 VIEWS COMPARISON:  Fluoroscopic images of same day. FINDINGS: The right femoral and acetabular components appear to be well situated. Expected postoperative changes are seen in the surrounding soft tissues. 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 IMPRESSION: Status post right total hip arthroplasty. Electronically Signed   By: Marijo Conception M.D.    On: 10/13/2018 14:53   Dg C-arm 1-60 Min  Result Date: 10/13/2018 CLINICAL DATA:  Right hip arthroplasty. EXAM: DG C-ARM 1-60 MIN; OPERATIVE RIGHT HIP WITH PELVIS CONTRAST:  None. FLUOROSCOPY TIME:  Radiation Exposure Index (if provided by the fluoroscopic device): 2.0482 mGy. COMPARISON:  None. FINDINGS: Three intraoperative fluoroscopic images were obtained of the right hip. The right femoral and acetabular components appear to be well situated. IMPRESSION: Fluoroscopic guidance provided during right total hip arthroplasty. Electronically Signed   By: Marijo Conception M.D.   On: 10/13/2018 14:15   Dg Hip Operative Unilat W Or W/o Pelvis Right  Result Date: 10/13/2018 CLINICAL DATA:  Right hip arthroplasty. EXAM: DG C-ARM 1-60 MIN; OPERATIVE RIGHT HIP WITH PELVIS CONTRAST:  None. FLUOROSCOPY TIME:  Radiation Exposure Index (if provided by the fluoroscopic device): 2.0482 mGy. COMPARISON:  None. FINDINGS: Three intraoperative fluoroscopic images were obtained of the right hip. The right femoral and acetabular components appear to be well situated. IMPRESSION: Fluoroscopic guidance provided during right total hip arthroplasty. Electronically Signed   By: Marijo Conception M.D.   On: 10/13/2018 14:15    Disposition: Discharge disposition: 01-Home or Self Care         Follow-up Information    Mcarthur Rossetti, MD. Schedule an appointment as soon as possible for a visit in 2 week(s).   Specialty: Orthopedic Surgery Contact information: [place] [address] [phone]        [facility] Follow up.   Specialty: Home Health Services Why: Home Health Physical Therapy Contact information: [place] [address] [phone]        [facility] Follow up.   Why: Bedside Commode; Rolling Walker Contact information: [place] [address] [phone]            Signed: Mcarthur Rossetti 10/14/2018, 4:16 PM

## 2018-10-14 NOTE — Discharge Instructions (Signed)

## 2018-10-19 ENCOUNTER — Telehealth: Payer: Self-pay | Admitting: Orthopaedic Surgery

## 2018-10-19 NOTE — Telephone Encounter (Signed)
[name] with [facility] called in requesting verbal orders for pt for 1 time a week for 1 week, 3 times a week for 1 week and 2 times a week for 1 week.   [phone]

## 2018-10-19 NOTE — Telephone Encounter (Signed)
That will be fine. Thanks 

## 2018-10-19 NOTE — Telephone Encounter (Signed)
Ok for verbal orders ?

## 2018-10-19 NOTE — Telephone Encounter (Signed)
I called [name] and advised. 

## 2018-10-23 ENCOUNTER — Telehealth: Payer: Self-pay | Admitting: Orthopaedic Surgery

## 2018-10-23 NOTE — Telephone Encounter (Signed)
Jacinta Shoe with [facility] called to let Dr. Ninfa Linden know that the patient cancelled his therapy visit today due to pain and was offered an alternative appointment for tomorrow and has declined that as well.  Patient will resume therapy on Monday.  [phone].  Thank you.

## 2018-10-29 ENCOUNTER — Inpatient Hospital Stay: Payer: Medicare Other | Admitting: Physician Assistant

## 2018-11-05 ENCOUNTER — Ambulatory Visit (INDEPENDENT_AMBULATORY_CARE_PROVIDER_SITE_OTHER): Payer: Medicare Other | Admitting: Physician Assistant

## 2018-11-05 ENCOUNTER — Other Ambulatory Visit: Payer: Self-pay

## 2018-11-05 ENCOUNTER — Encounter: Payer: Self-pay | Admitting: Physician Assistant

## 2018-11-05 DIAGNOSIS — Z96641 Presence of right artificial hip joint: Secondary | ICD-10-CM

## 2018-11-05 NOTE — Progress Notes (Signed)
HPI: Mr. Romm returns now 2 weeks status post right total hip arthroplasty.  He is overall doing well.  He has no concerns.  He stopped taking pain medicine and muscle relaxant.  He is taking a 1 mg aspirin twice daily.  Denies any fevers chills shortness of breath chest pain.  Physical exam: Right hip surgical incisions healing well no signs of infection.  Skin is well approximated with running subcu stitch.  Calf supple nontender.  Right ankle dorsiflexion plantarflexion intact.  Impression: 2-week status post right total hip arthroplasty  Plan: He will work on scar tissue mobilization.  His own incision wet in shower.  He will remain on 81 mg aspirin once daily for another week and then discontinue.  Follow-up with [place] in 1 month sooner if there is any questions concerns.

## 2018-12-03 ENCOUNTER — Ambulatory Visit: Payer: Medicare Other | Admitting: Physician Assistant

## 2018-12-16 ENCOUNTER — Encounter: Payer: Self-pay | Admitting: Physician Assistant

## 2018-12-16 ENCOUNTER — Other Ambulatory Visit: Payer: Self-pay

## 2018-12-16 ENCOUNTER — Ambulatory Visit (INDEPENDENT_AMBULATORY_CARE_PROVIDER_SITE_OTHER): Payer: Medicare Other | Admitting: Physician Assistant

## 2018-12-16 VITALS — Temp 97.0°F | Ht 65.0 in | Wt 150.0 lb

## 2018-12-16 DIAGNOSIS — M1611 Unilateral primary osteoarthritis, right hip: Secondary | ICD-10-CM

## 2018-12-16 DIAGNOSIS — M1612 Unilateral primary osteoarthritis, left hip: Secondary | ICD-10-CM

## 2018-12-16 DIAGNOSIS — Z96641 Presence of right artificial hip joint: Secondary | ICD-10-CM

## 2018-12-16 NOTE — Progress Notes (Signed)
HPI: [name] returns today 64 days status post right total hip arthroplasty.  He is overall doing well.  He is walking approximately 2 miles a day.  His only concern is occasionally he has a tingling bee sting-like sensation anterior aspect of the right hip.  Denies any fevers chills shortness of breath chest pain. Would like to have a left total hip replacement sometime in late January early February 2021.  Review of systems: See HPI otherwise negative  Physical exam right hip excellent range of motion without pain.  Right calf supple nontender.  Dorsiflexion plantarflexion right ankle intact.  Left hip virtually no internal or external rotation.  Impression:  Status Post right total hip arthroplasty Left hip osteoarthritis  Plan: In regards to the burning stinging sensation in the hip I like for him to try vitamin B6 100 mg twice daily.  Given his end-stage arthritis of the left hip and the fact that he is done so well with the right hip arthroplasty recommend left total hip arthroplasty near future.  Questions encouraged and answered.  He understands risk benefits surgery.  Understands postop protocol as he is now just 64 days status post right total hip arthroplasty.  Given Samella Parr card will call to schedule surgery with her.  A surgical sheet was filled out on the patient today

## 2019-05-28 ENCOUNTER — Telehealth: Payer: Self-pay | Admitting: Radiation Oncology

## 2019-05-28 NOTE — Telephone Encounter (Signed)
Received voicemail message from patient wanting to know when his next hormone injection is due. Phoned patient back. No answer. Left detailed message explaining he has called Dr. Johny Shears office by mistake. Provided him the number to Dr. Clovis Cao office.

## 2019-07-08 ENCOUNTER — Other Ambulatory Visit: Payer: Self-pay

## 2019-10-26 ENCOUNTER — Inpatient Hospital Stay: Payer: Medicare Other | Admitting: Orthopaedic Surgery

## 2019-11-15 ENCOUNTER — Other Ambulatory Visit: Payer: Self-pay

## 2020-01-31 ENCOUNTER — Other Ambulatory Visit: Payer: Self-pay | Admitting: Physician Assistant

## 2020-02-04 ENCOUNTER — Other Ambulatory Visit (HOSPITAL_COMMUNITY): Payer: Medicare Other

## 2020-02-04 ENCOUNTER — Other Ambulatory Visit: Payer: Self-pay

## 2020-02-04 ENCOUNTER — Encounter (HOSPITAL_COMMUNITY)
Admission: RE | Admit: 2020-02-04 | Discharge: 2020-02-04 | Disposition: A | Discharge status: Home / Self Care | Payer: Medicare Other | Source: Ambulatory Visit | Attending: Orthopaedic Surgery | Admitting: Orthopaedic Surgery

## 2020-02-04 ENCOUNTER — Encounter (HOSPITAL_COMMUNITY): Payer: Self-pay

## 2020-02-04 DIAGNOSIS — Z01812 Encounter for preprocedural laboratory examination: Secondary | ICD-10-CM | POA: Insufficient documentation

## 2020-02-04 LAB — BASIC METABOLIC PANEL
Anion gap: 11 (ref 5–15)
BUN: 18 mg/dL (ref 8–23)
CO2: 26 mmol/L (ref 22–32)
Calcium: 10 mg/dL (ref 8.9–10.3)
Chloride: 102 mmol/L (ref 98–111)
Creatinine, Ser: 1.34 mg/dL — ABNORMAL HIGH (ref 0.61–1.24)
GFR, Estimated: 56 mL/min — ABNORMAL LOW (ref >60)
Glucose, Bld: 117 mg/dL — ABNORMAL HIGH (ref 70–99)
Potassium: 4 mmol/L (ref 3.5–5.1)
Sodium: 139 mmol/L (ref 135–145)

## 2020-02-04 LAB — CBC
HCT: 38.9 % — ABNORMAL LOW (ref 39.0–52.0)
Hemoglobin: 13.3 g/dL (ref 13.0–17.0)
MCH: 31.4 pg (ref 26.0–34.0)
MCHC: 34.2 g/dL (ref 30.0–36.0)
MCV: 91.7 fL (ref 80.0–100.0)
Platelets: 281 10*3/uL (ref 150–400)
RBC: 4.24 MIL/uL (ref 4.22–5.81)
RDW: 13.7 % (ref 11.5–15.5)
WBC: 5.2 10*3/uL (ref 4.0–10.5)
nRBC: 0 % (ref 0.0–0.2)

## 2020-02-04 LAB — SURGICAL PCR SCREEN
MRSA, PCR: NEGATIVE
Staphylococcus aureus: NEGATIVE

## 2020-02-04 NOTE — Pre-Procedure Instructions (Addendum)
Wilkins Elpers  02/04/2020      Your procedure is scheduled on Tuesday, January 11.  Report to [place] Stay  at 10:00 AM                Your surgery or procedure is scheduled to begin at 1200 noon                   .you   Call this number if you have problems the morning of surgery: [phone]  This is the number for the Pre- Surgical Desk.                For any other questions, please call [phone],  Monday - Friday 8 AM - 4 PM.   Remember:  Do not eat after midnight, Monday, January 10th.  You may drink clear liquids until 9:00 AM.   Clear liquids allowed are:                     Water, Juice (non-citric and without pulp - diabetics please choose diet or no sugar options), Carbonated beverages - (diabetics please choose diet or no sugar options), Clear Tea, Black Coffee only (no creamer, milk or cream including half and half), Plain Jell-O only (diabetics please choose diet or no sugar options), Gatorade (diabetics please choose diet or no sugar options) and Plain Popsicles only.            Please Drink the Pre-Surgery Ensure you were given between 08:00 and 9:00 AM, nothing to drink after 9:00AM   Take these medicines the morning of surgery with A SIP OF WATER: XTANDI    STOP taking Aspirin, Aspirin Products (Goody Powder, Excedrin Migraine), Ibuprofen (Advil), Naproxen (Aleve), Vitamins and Herbal Products (ie Fish Oil).    Special instructions:    [facility]- Preparing For Surgery  Before surgery, you can play an important role. Because skin is not sterile, your skin needs to be as free of germs as possible. You can reduce the number of germs on your skin by washing with CHG (chlorahexidine gluconate) Soap before surgery.  CHG is an antiseptic cleaner which kills germs and bonds with the skin to continue killing germs even after washing.    Oral Hygiene is also important to reduce your risk of infection.  Remember - BRUSH YOUR TEETH THE MORNING OF SURGERY WITH  YOUR REGULAR TOOTHPASTE  Please do not use if you have an allergy to CHG or antibacterial soaps. If your skin becomes reddened/irritated stop using the CHG.  Do not shave (including legs and underarms) for at least 48 hours prior to first CHG shower. It is OK to shave your face.  Please follow these instructions carefully.   1. Shower the NIGHT BEFORE SURGERY and the MORNING OF SURGERY with CHG.   2. If you chose to wash your hair, wash your hair first as usual with your normal shampoo.  3. After you shampoo, wash your face and private area with the soap you use at home, then rinse your hair and body thoroughly to remove the shampoo and soap.  4. Use CHG as you would any other liquid soap. You can apply CHG directly to the skin and wash gently with a scrungie or a clean washcloth.   5. Apply the CHG Soap to your body ONLY FROM THE NECK DOWN.  Do not use on open wounds or open sores. Avoid contact with your eyes, ears, mouth and genitals (private  parts).   6. Wash thoroughly, paying special attention to the area where your surgery will be performed.  7. Thoroughly rinse your body with warm water from the neck down.  8. DO NOT shower/wash with your normal soap after using and rinsing off the CHG Soap.  9. Pat yourself dry with a CLEAN TOWEL.  10. Wear CLEAN PAJAMAS to bed the night before surgery, wear comfortable clothes the morning of surgery  11. Place CLEAN SHEETS on your bed the night of your first shower and DO NOT SLEEP WITH PETS.  Day of Surgery: Shower as instructed above. Do not apply any deodorants/lotions, powders or colognes.  Please wear clean clothes to the hospital/surgery center.   Remember to brush your teeth WITH YOUR REGULAR TOOTHPASTE.  Do not wear jewelry, make-up or nail polish.  Men may shave face and neck.  Do not bring valuables to the hospital.  [facility] is not responsible for any belongings or valuables.  Contacts, dentures or bridgework may not be  worn into surgery.  Leave your suitcase in the car.  After surgery it may be brought to your room.  For patients admitted to the hospital, discharge time will be determined by your treatment team.  Patients discharged the day of surgery will not be allowed to drive home.   Please read over the fact sheets that you were given.

## 2020-02-04 NOTE — Progress Notes (Signed)
PCP - Dr. Philip Aspen Cardiologist -   PPM/ICD - n/a Device Orders -  Rep Notified -   Chest x-ray - n/a EKG - n/a Stress Test - patient denies ECHO - patient denies Cardiac Cath - patient denies  Sleep Study - n/a, negative stop bang CPAP -   Fasting Blood Sugar - n/a Checks Blood Sugar _____ times a day  Blood Thinner Instructions: n/a Aspirin Instructions:  ERAS Protcol - clears until 0900 morning of surgery PRE-SURGERY Ensure or G2- Ensure provided to patient  COVID TEST- will need to be tested day of surgery.  Patient uses public transportation and will not be able to quarantine after test.  Patient instructed to arrive to [facility] short stay at 0900am morning of surgery to be tested   Anesthesia review: n/a  Patient denies shortness of breath, fever, cough and chest pain at PAT appointment   All instructions explained to the patient, with a verbal understanding of the material. Patient agrees to go over the instructions while at home for a better understanding. Patient also instructed to self quarantine after being tested for COVID-19. The opportunity to ask questions was provided.

## 2020-02-05 ENCOUNTER — Inpatient Hospital Stay (HOSPITAL_COMMUNITY): Admission: RE | Admit: 2020-02-05 | Payer: Medicare Other | Source: Ambulatory Visit

## 2020-02-08 ENCOUNTER — Other Ambulatory Visit: Payer: Self-pay

## 2020-02-08 ENCOUNTER — Observation Stay (HOSPITAL_COMMUNITY)
Admission: RE | Admit: 2020-02-08 | Discharge: 2020-02-09 | Disposition: A | Discharge status: Home / Self Care | Payer: Medicare Other | Attending: Orthopaedic Surgery | Admitting: Orthopaedic Surgery

## 2020-02-08 ENCOUNTER — Encounter (HOSPITAL_COMMUNITY): Payer: Self-pay | Admitting: Orthopaedic Surgery

## 2020-02-08 ENCOUNTER — Ambulatory Visit (HOSPITAL_COMMUNITY): Payer: Medicare Other | Admitting: Certified Registered Nurse Anesthetist

## 2020-02-08 ENCOUNTER — Encounter (HOSPITAL_COMMUNITY)
Admission: RE | Disposition: A | Discharge status: Home / Self Care | Payer: Self-pay | Source: Home / Self Care | Attending: Orthopaedic Surgery

## 2020-02-08 ENCOUNTER — Ambulatory Visit (HOSPITAL_COMMUNITY): Payer: Medicare Other

## 2020-02-08 ENCOUNTER — Observation Stay (HOSPITAL_COMMUNITY): Payer: Medicare Other

## 2020-02-08 ENCOUNTER — Ambulatory Visit (HOSPITAL_COMMUNITY): Payer: Medicare Other | Admitting: Vascular Surgery

## 2020-02-08 DIAGNOSIS — Z8546 Personal history of malignant neoplasm of prostate: Secondary | ICD-10-CM | POA: Insufficient documentation

## 2020-02-08 DIAGNOSIS — Z20822 Contact with and (suspected) exposure to covid-19: Secondary | ICD-10-CM | POA: Diagnosis not present

## 2020-02-08 DIAGNOSIS — Z419 Encounter for procedure for purposes other than remedying health state, unspecified: Secondary | ICD-10-CM

## 2020-02-08 DIAGNOSIS — M1612 Unilateral primary osteoarthritis, left hip: Principal | ICD-10-CM | POA: Diagnosis present

## 2020-02-08 DIAGNOSIS — Z96641 Presence of right artificial hip joint: Secondary | ICD-10-CM | POA: Diagnosis not present

## 2020-02-08 DIAGNOSIS — Z87891 Personal history of nicotine dependence: Secondary | ICD-10-CM | POA: Diagnosis not present

## 2020-02-08 DIAGNOSIS — Z96642 Presence of left artificial hip joint: Secondary | ICD-10-CM

## 2020-02-08 DIAGNOSIS — M25552 Pain in left hip: Secondary | ICD-10-CM | POA: Diagnosis present

## 2020-02-08 HISTORY — PX: TOTAL HIP ARTHROPLASTY: SHX124

## 2020-02-08 LAB — SARS CORONAVIRUS 2 BY RT PCR (HOSPITAL ORDER, PERFORMED IN ~~LOC~~ HOSPITAL LAB): SARS Coronavirus 2: NEGATIVE

## 2020-02-08 SURGERY — ARTHROPLASTY, HIP, TOTAL, ANTERIOR APPROACH
Anesthesia: Spinal | Site: Hip | Laterality: Left

## 2020-02-08 MED ORDER — TRANEXAMIC ACID-NACL 1000-0.7 MG/100ML-% IV SOLN
INTRAVENOUS | Status: AC
  Filled 2020-02-08: qty 100

## 2020-02-08 MED ORDER — EPHEDRINE SULFATE-NACL 50-0.9 MG/10ML-% IV SOSY
PREFILLED_SYRINGE | INTRAVENOUS | Status: DC | PRN
  Administered 2020-02-08: 10 mg via INTRAVENOUS

## 2020-02-08 MED ORDER — OXYCODONE HCL 5 MG/5ML PO SOLN: 5.0000 mg | Freq: Once | ORAL | Status: DC | PRN

## 2020-02-08 MED ORDER — TAMSULOSIN HCL 0.4 MG PO CAPS
0.4000 mg | ORAL_CAPSULE | Freq: Every day | ORAL | Status: DC
Start: 2020-02-08 — End: 2020-02-09
  Administered 2020-02-08: 0.4 mg via ORAL
  Filled 2020-02-08: qty 1

## 2020-02-08 MED ORDER — PANTOPRAZOLE SODIUM 40 MG PO TBEC
40.0000 mg | DELAYED_RELEASE_TABLET | Freq: Every day | ORAL | Status: DC
  Administered 2020-02-09: 40 mg via ORAL
  Filled 2020-02-08: qty 1

## 2020-02-08 MED ORDER — METHOCARBAMOL 500 MG PO TABS
500.0000 mg | ORAL_TABLET | Freq: Four times a day (QID) | ORAL | Status: DC | PRN
  Administered 2020-02-08 – 2020-02-09 (×3): 500 mg via ORAL
  Filled 2020-02-08 (×3): qty 1

## 2020-02-08 MED ORDER — FENTANYL CITRATE (PF) 100 MCG/2ML IJ SOLN
INTRAMUSCULAR | Status: DC | PRN
  Administered 2020-02-08 (×2): 50 ug via INTRAVENOUS

## 2020-02-08 MED ORDER — PHENOL 1.4 % MT LIQD: 1.0000 | OROMUCOSAL | Status: DC | PRN

## 2020-02-08 MED ORDER — LACTATED RINGERS IV SOLN: INTRAVENOUS | Status: DC

## 2020-02-08 MED ORDER — BUPIVACAINE IN DEXTROSE 0.75-8.25 % IT SOLN
INTRATHECAL | Status: DC | PRN
  Administered 2020-02-08: 2 mL via INTRATHECAL

## 2020-02-08 MED ORDER — ASPIRIN 81 MG PO CHEW
81.0000 mg | CHEWABLE_TABLET | Freq: Two times a day (BID) | ORAL | Status: DC
  Administered 2020-02-08 – 2020-02-09 (×2): 81 mg via ORAL
  Filled 2020-02-08 (×2): qty 1

## 2020-02-08 MED ORDER — PHENYLEPHRINE HCL-NACL 10-0.9 MG/250ML-% IV SOLN
INTRAVENOUS | Status: DC | PRN
  Administered 2020-02-08: 60 ug/min via INTRAVENOUS

## 2020-02-08 MED ORDER — ORAL CARE MOUTH RINSE: 15.0000 mL | Freq: Once | OROMUCOSAL | Status: AC

## 2020-02-08 MED ORDER — SODIUM CHLORIDE 0.9 % IV SOLN: INTRAVENOUS | Status: DC

## 2020-02-08 MED ORDER — CEFAZOLIN SODIUM-DEXTROSE 2-4 GM/100ML-% IV SOLN
INTRAVENOUS | Status: AC
  Filled 2020-02-08: qty 100

## 2020-02-08 MED ORDER — CHLORHEXIDINE GLUCONATE 0.12 % MT SOLN
15.0000 mL | Freq: Once | OROMUCOSAL | Status: AC
  Administered 2020-02-08: 15 mL via OROMUCOSAL

## 2020-02-08 MED ORDER — ONDANSETRON HCL 4 MG/2ML IJ SOLN: 4.0000 mg | Freq: Four times a day (QID) | INTRAMUSCULAR | Status: DC | PRN

## 2020-02-08 MED ORDER — ALUM & MAG HYDROXIDE-SIMETH 200-200-20 MG/5ML PO SUSP: 30.0000 mL | ORAL | Status: DC | PRN

## 2020-02-08 MED ORDER — HYDROMORPHONE HCL 1 MG/ML IJ SOLN
0.5000 mg | INTRAMUSCULAR | Status: DC | PRN
  Administered 2020-02-08 – 2020-02-09 (×3): 1 mg via INTRAVENOUS
  Filled 2020-02-08 (×3): qty 1

## 2020-02-08 MED ORDER — TAMSULOSIN HCL 0.4 MG PO CAPS: 0.4000 mg | ORAL_CAPSULE | Freq: Every day | ORAL | Status: DC

## 2020-02-08 MED ORDER — MIDAZOLAM HCL 5 MG/5ML IJ SOLN
INTRAMUSCULAR | Status: DC | PRN
  Administered 2020-02-08: 2 mg via INTRAVENOUS

## 2020-02-08 MED ORDER — MIDAZOLAM HCL 2 MG/2ML IJ SOLN
INTRAMUSCULAR | Status: AC
  Filled 2020-02-08: qty 2

## 2020-02-08 MED ORDER — ONDANSETRON HCL 4 MG/2ML IJ SOLN: 4.0000 mg | Freq: Once | INTRAMUSCULAR | Status: DC | PRN

## 2020-02-08 MED ORDER — DIPHENHYDRAMINE HCL 12.5 MG/5ML PO ELIX: 12.5000 mg | ORAL_SOLUTION | ORAL | Status: DC | PRN

## 2020-02-08 MED ORDER — ONDANSETRON HCL 4 MG PO TABS: 4.0000 mg | ORAL_TABLET | Freq: Four times a day (QID) | ORAL | Status: DC | PRN

## 2020-02-08 MED ORDER — DOCUSATE SODIUM 100 MG PO CAPS
100.0000 mg | ORAL_CAPSULE | Freq: Two times a day (BID) | ORAL | Status: DC
  Administered 2020-02-08 – 2020-02-09 (×2): 100 mg via ORAL
  Filled 2020-02-08 (×2): qty 1

## 2020-02-08 MED ORDER — FENTANYL CITRATE (PF) 100 MCG/2ML IJ SOLN: 25.0000 ug | INTRAMUSCULAR | Status: DC | PRN

## 2020-02-08 MED ORDER — STERILE WATER FOR IRRIGATION IR SOLN
Status: DC | PRN
  Administered 2020-02-08: 2000 mL

## 2020-02-08 MED ORDER — TRANEXAMIC ACID-NACL 1000-0.7 MG/100ML-% IV SOLN
1000.0000 mg | INTRAVENOUS | Status: AC
  Administered 2020-02-08: 1000 mg via INTRAVENOUS

## 2020-02-08 MED ORDER — OXYCODONE HCL 5 MG PO TABS
10.0000 mg | ORAL_TABLET | ORAL | Status: DC | PRN
  Administered 2020-02-08 – 2020-02-09 (×4): 15 mg via ORAL
  Filled 2020-02-08 (×4): qty 3

## 2020-02-08 MED ORDER — 0.9 % SODIUM CHLORIDE (POUR BTL) OPTIME
TOPICAL | Status: DC | PRN
  Administered 2020-02-08: 1000 mL

## 2020-02-08 MED ORDER — VITAMIN D 25 MCG (1000 UNIT) PO TABS
1000.0000 [IU] | ORAL_TABLET | Freq: Every day | ORAL | Status: DC
  Administered 2020-02-09: 1000 [IU] via ORAL
  Filled 2020-02-08: qty 1

## 2020-02-08 MED ORDER — SODIUM CHLORIDE 0.9 % IR SOLN
Status: DC | PRN
  Administered 2020-02-08: 1000 mL

## 2020-02-08 MED ORDER — GABAPENTIN 100 MG PO CAPS
100.0000 mg | ORAL_CAPSULE | Freq: Three times a day (TID) | ORAL | Status: DC
  Administered 2020-02-08 – 2020-02-09 (×3): 100 mg via ORAL
  Filled 2020-02-08 (×3): qty 1

## 2020-02-08 MED ORDER — METHOCARBAMOL 500 MG IVPB - SIMPLE MED
500.0000 mg | Freq: Four times a day (QID) | INTRAVENOUS | Status: DC | PRN
  Administered 2020-02-08: 500 mg via INTRAVENOUS
  Filled 2020-02-08: qty 50

## 2020-02-08 MED ORDER — METHOCARBAMOL 500 MG PO TABS: 500.0000 mg | ORAL_TABLET | Freq: Four times a day (QID) | ORAL | 1 refills | Status: AC | PRN

## 2020-02-08 MED ORDER — POLYETHYLENE GLYCOL 3350 17 G PO PACK: 17.0000 g | PACK | Freq: Every day | ORAL | Status: DC | PRN

## 2020-02-08 MED ORDER — OXYCODONE HCL 5 MG PO TABS
5.0000 mg | ORAL_TABLET | ORAL | Status: DC | PRN
Start: 2020-02-08 — End: 2020-02-09
  Administered 2020-02-08: 5 mg via ORAL
  Administered 2020-02-08: 10 mg via ORAL
  Filled 2020-02-08: qty 2

## 2020-02-08 MED ORDER — CEFAZOLIN SODIUM-DEXTROSE 1-4 GM/50ML-% IV SOLN
1.0000 g | Freq: Four times a day (QID) | INTRAVENOUS | Status: AC
  Administered 2020-02-08 – 2020-02-09 (×2): 1 g via INTRAVENOUS
  Filled 2020-02-08 (×2): qty 50

## 2020-02-08 MED ORDER — ACETAMINOPHEN 325 MG PO TABS: 325.0000 mg | ORAL_TABLET | Freq: Four times a day (QID) | ORAL | Status: DC | PRN

## 2020-02-08 MED ORDER — ENZALUTAMIDE 40 MG PO CAPS: 80.0000 mg | ORAL_CAPSULE | Freq: Every day | ORAL | Status: DC

## 2020-02-08 MED ORDER — PROPOFOL 500 MG/50ML IV EMUL
INTRAVENOUS | Status: DC | PRN
  Administered 2020-02-08: 30 mg via INTRAVENOUS
  Administered 2020-02-08: 20 mg via INTRAVENOUS

## 2020-02-08 MED ORDER — METOCLOPRAMIDE HCL 5 MG PO TABS: 5.0000 mg | ORAL_TABLET | Freq: Three times a day (TID) | ORAL | Status: DC | PRN

## 2020-02-08 MED ORDER — CEFAZOLIN SODIUM-DEXTROSE 2-4 GM/100ML-% IV SOLN
2.0000 g | INTRAVENOUS | Status: AC
  Administered 2020-02-08: 2 g via INTRAVENOUS

## 2020-02-08 MED ORDER — ACETAMINOPHEN 160 MG/5ML PO SOLN: 325.0000 mg | ORAL | Status: DC | PRN

## 2020-02-08 MED ORDER — MEPERIDINE HCL 50 MG/ML IJ SOLN: 6.2500 mg | INTRAMUSCULAR | Status: DC | PRN

## 2020-02-08 MED ORDER — OXYCODONE HCL 5 MG PO TABS: 5.0000 mg | ORAL_TABLET | Freq: Once | ORAL | Status: DC | PRN

## 2020-02-08 MED ORDER — ACETAMINOPHEN 325 MG PO TABS: 325.0000 mg | ORAL_TABLET | ORAL | Status: DC | PRN

## 2020-02-08 MED ORDER — POVIDONE-IODINE 10 % EX SWAB
2.0000 "application " | Freq: Once | CUTANEOUS | Status: AC
  Administered 2020-02-08: 2 via TOPICAL

## 2020-02-08 MED ORDER — BISACODYL 5 MG PO TBEC: 5.0000 mg | DELAYED_RELEASE_TABLET | Freq: Every day | ORAL | Status: DC | PRN

## 2020-02-08 MED ORDER — ASPIRIN 81 MG PO CHEW: 81.0000 mg | CHEWABLE_TABLET | Freq: Two times a day (BID) | ORAL | 0 refills | Status: AC

## 2020-02-08 MED ORDER — MENTHOL 3 MG MT LOZG: 1.0000 | LOZENGE | OROMUCOSAL | Status: DC | PRN

## 2020-02-08 MED ORDER — PROPOFOL 500 MG/50ML IV EMUL
INTRAVENOUS | Status: DC | PRN
  Administered 2020-02-08: 80 ug/kg/min via INTRAVENOUS

## 2020-02-08 MED ORDER — OXYCODONE HCL 5 MG PO TABS
ORAL_TABLET | ORAL | Status: AC
  Filled 2020-02-08: qty 1

## 2020-02-08 MED ORDER — LACTATED RINGERS IV SOLN: INTRAVENOUS | Status: DC | PRN

## 2020-02-08 MED ORDER — ONDANSETRON HCL 4 MG/2ML IJ SOLN
INTRAMUSCULAR | Status: DC | PRN
  Administered 2020-02-08: 4 mg via INTRAVENOUS

## 2020-02-08 MED ORDER — METOCLOPRAMIDE HCL 5 MG/ML IJ SOLN: 5.0000 mg | Freq: Three times a day (TID) | INTRAMUSCULAR | Status: DC | PRN

## 2020-02-08 MED ORDER — FENTANYL CITRATE (PF) 100 MCG/2ML IJ SOLN
INTRAMUSCULAR | Status: AC
  Filled 2020-02-08: qty 2

## 2020-02-08 MED ORDER — OXYCODONE HCL 5 MG PO TABS: 5.0000 mg | ORAL_TABLET | ORAL | 0 refills | Status: DC | PRN

## 2020-02-08 MED ORDER — METHOCARBAMOL 500 MG IVPB - SIMPLE MED
INTRAVENOUS | Status: AC
  Filled 2020-02-08: qty 50

## 2020-02-08 SURGICAL SUPPLY — 39 items
ACETAB CUP W/GRIPTION 54 (Plate) ×2 IMPLANT
ARTICULEZE HEAD (Hips) ×2 IMPLANT
BAG ZIPLOCK 12X15 (MISCELLANEOUS) IMPLANT
BENZOIN TINCTURE PRP APPL 2/3 (GAUZE/BANDAGES/DRESSINGS) IMPLANT
BLADE SAW SGTL 18X1.27X75 (BLADE) ×2 IMPLANT
COVER PERINEAL POST (MISCELLANEOUS) ×2 IMPLANT
COVER SURGICAL LIGHT HANDLE (MISCELLANEOUS) ×2 IMPLANT
COVER WAND RF STERILE (DRAPES) ×2 IMPLANT
CUP ACETAB W/GRIPTION 54 (Plate) ×1 IMPLANT
DRAPE STERI IOBAN 125X83 (DRAPES) ×2 IMPLANT
DRAPE U-SHAPE 47X51 STRL (DRAPES) ×4 IMPLANT
DRSG AQUACEL AG ADV 3.5X10 (GAUZE/BANDAGES/DRESSINGS) ×2 IMPLANT
DURAPREP 26ML APPLICATOR (WOUND CARE) ×2 IMPLANT
ELECT REM PT RETURN 15FT ADLT (MISCELLANEOUS) ×2 IMPLANT
GAUZE XEROFORM 1X8 LF (GAUZE/BANDAGES/DRESSINGS) ×2 IMPLANT
GLOVE BIO SURGEON STRL SZ7.5 (GLOVE) ×2 IMPLANT
GLOVE ECLIPSE 8.0 STRL XLNG CF (GLOVE) ×2 IMPLANT
GLOVE SRG 8 PF TXTR STRL LF DI (GLOVE) ×2 IMPLANT
GLOVE SURG UNDER POLY LF SZ8 (GLOVE) ×2
GOWN STRL REUS W/TWL XL LVL3 (GOWN DISPOSABLE) ×4 IMPLANT
HANDPIECE INTERPULSE COAX TIP (DISPOSABLE) ×1
HEAD ARTICULEZE (Hips) ×1 IMPLANT
HOLDER FOLEY CATH W/STRAP (MISCELLANEOUS) ×2 IMPLANT
KIT TURNOVER KIT A (KITS) IMPLANT
LINER NEUTRAL 36ID 54OD (Liner) ×2 IMPLANT
PACK ANTERIOR HIP CUSTOM (KITS) ×2 IMPLANT
PENCIL SMOKE EVACUATOR (MISCELLANEOUS) IMPLANT
SET HNDPC FAN SPRY TIP SCT (DISPOSABLE) ×1 IMPLANT
STAPLER VISISTAT 35W (STAPLE) IMPLANT
STEM FEMORAL SZ6 HIGH ACTIS (Stem) ×2 IMPLANT
STRIP CLOSURE SKIN 1/2X4 (GAUZE/BANDAGES/DRESSINGS) IMPLANT
SUT ETHIBOND NAB CT1 #1 30IN (SUTURE) ×2 IMPLANT
SUT ETHILON 2 0 PS N (SUTURE) IMPLANT
SUT MNCRL AB 4-0 PS2 18 (SUTURE) IMPLANT
SUT VIC AB 0 CT1 36 (SUTURE) ×2 IMPLANT
SUT VIC AB 1 CT1 36 (SUTURE) ×2 IMPLANT
SUT VIC AB 2-0 CT1 27 (SUTURE) ×2
SUT VIC AB 2-0 CT1 TAPERPNT 27 (SUTURE) ×2 IMPLANT
TRAY FOLEY MTR SLVR 16FR STAT (SET/KITS/TRAYS/PACK) IMPLANT

## 2020-02-08 NOTE — Anesthesia Preprocedure Evaluation (Signed)
Anesthesia Evaluation  Patient identified by MRN, date of birth, ID band Patient awake    Reviewed: Allergy & Precautions, NPO status , Patient's Chart, lab work & pertinent test results  Airway Mallampati: I  TM Distance: >3 FB Neck ROM: Full    Dental no notable dental hx. (+) Poor Dentition, Missing, Loose,    Pulmonary neg pulmonary ROS, former smoker,  Quit smoking 1965   Pulmonary exam normal breath sounds clear to auscultation       Cardiovascular negative cardio ROS Normal cardiovascular exam Rhythm:Regular Rate:Normal     Neuro/Psych negative neurological ROS  negative psych ROS   GI/Hepatic negative GI ROS, Neg liver ROS,   Endo/Other  negative endocrine ROS  Renal/GU negative Renal ROS  negative genitourinary   Musculoskeletal  (+) Arthritis , Osteoarthritis,    Abdominal   Peds negative pediatric ROS (+)  Hematology negative hematology ROS (+)   Anesthesia Other Findings   Reproductive/Obstetrics negative OB ROS                             Anesthesia Physical  Anesthesia Plan  ASA: III  Anesthesia Plan: Spinal   Post-op Pain Management:    Induction:   PONV Risk Score and Plan: 2 and Propofol infusion and TIVA  Airway Management Planned: Natural Airway and Nasal Cannula  Additional Equipment: None  Intra-op Plan:   Post-operative Plan:   Informed Consent: I have reviewed the patients History and Physical, chart, labs and discussed the procedure including the risks, benefits and alternatives for the proposed anesthesia with the patient or authorized representative who has indicated his/her understanding and acceptance.       Plan Discussed with: CRNA and Anesthesiologist  Anesthesia Plan Comments:         Anesthesia Quick Evaluation

## 2020-02-08 NOTE — Discharge Instructions (Signed)

## 2020-02-08 NOTE — Anesthesia Procedure Notes (Signed)
Spinal  Patient location during procedure: OR Start time: 02/08/2020 12:00 PM End time: 02/08/2020 12:06 PM Staffing Anesthesiologist: Janeece Riggers, MD Preanesthetic Checklist Completed: patient identified, IV checked, site marked, risks and benefits discussed, surgical consent, monitors and equipment checked, pre-op evaluation and timeout performed Spinal Block Patient position: sitting Prep: DuraPrep Patient monitoring: heart rate, cardiac monitor, continuous pulse ox and blood pressure Approach: midline Location: L3-4 Injection technique: single-shot Needle Needle type: Sprotte  Needle gauge: 24 G Needle length: 9 cm Assessment Sensory level: T4

## 2020-02-08 NOTE — Transfer of Care (Signed)
Immediate Anesthesia Transfer of Care Note  Patient: [name]  Procedure(s) Performed: LEFT TOTAL HIP ARTHROPLASTY ANTERIOR APPROACH (Left Hip)  Patient Location: PACU  Anesthesia Type:Spinal  Level of Consciousness: awake, alert  and oriented  Airway & Oxygen Therapy: Patient Spontanous Breathing and Patient connected to face mask  Post-op Assessment: Report given to RN and Post -op Vital signs reviewed and stable  Post vital signs: Reviewed and stable  Last Vitals:  Vitals Value Taken Time  BP 117/64 02/08/20 1335  Temp    Pulse 32 02/08/20 1339  Resp 16 02/08/20 1339  SpO2 100 % 02/08/20 1339  Vitals shown include unvalidated device data.  Last Pain:  Vitals:   02/08/20 1004  TempSrc:   PainSc: 0-No pain         Complications: No complications documented.

## 2020-02-08 NOTE — Brief Op Note (Signed)
02/08/2020  1:16 PM  PATIENT:  [name]  75 y.o. male  PRE-OPERATIVE DIAGNOSIS:  Osteoarthritis Left Hip  POST-OPERATIVE DIAGNOSIS:  Osteoarthritis Left Hip  PROCEDURE:  Procedure(s): LEFT TOTAL HIP ARTHROPLASTY ANTERIOR APPROACH (Left)  SURGEON:  Surgeon(s) and Role:    Mcarthur Rossetti, MD - Primary  PHYSICIAN ASSISTANT: Benita Stabile, PA-C  ANESTHESIA:   spinal  EBL:  150 mL   COUNTS:  YES  DICTATION: .Other Dictation: Dictation Number [id number]  PLAN OF CARE: Admit for overnight observation  PATIENT DISPOSITION:  PACU - hemodynamically stable.   Delay start of Pharmacological VTE agent (>24hrs) due to surgical blood loss or risk of bleeding:  no

## 2020-02-08 NOTE — H&P (Signed)
TOTAL HIP ADMISSION H&P  Patient is admitted for left total hip arthroplasty.  Subjective:  Chief Complaint: left hip pain  HPI: [name], 75 y.o. male, has a history of pain and functional disability in the left hip(s) due to arthritis and patient has failed non-surgical conservative treatments for greater than 12 weeks to include NSAID's and/or analgesics, corticosteriod injections, use of assistive devices and activity modification.  Onset of symptoms was gradual starting 3 years ago with gradually worsening course since that time.The patient noted no past surgery on the left hip(s).  Patient currently rates pain in the left hip at 10 out of 10 with activity. Patient has night pain, worsening of pain with activity and weight bearing, trendelenberg gait, pain that interfers with activities of daily living and pain with passive range of motion. Patient has evidence of subchondral cysts, subchondral sclerosis, periarticular osteophytes and joint space narrowing by imaging studies. This condition presents safety issues increasing the risk of falls.  There is no current active infection.  Patient Active Problem List   Diagnosis Date Noted  . Status post total replacement of right hip 10/13/2018  . Unilateral primary osteoarthritis, left hip 04/01/2018  . Unilateral primary osteoarthritis, right hip 04/01/2018   Past Medical History:  Diagnosis Date  . Arthritis   . Cancer ([place])    prostrate cancer  . Pneumonia    as a child    Past Surgical History:  Procedure Laterality Date  . HERNIA REPAIR     right inguinal   . PROSTATECTOMY    . TOTAL HIP ARTHROPLASTY Right 10/13/2018   Procedure: RIGHT TOTAL HIP ARTHROPLASTY ANTERIOR APPROACH;  Surgeon: Mcarthur Rossetti, MD;  Location: [place];  Service: Orthopedics;  Laterality: Right;    Current Facility-Administered Medications  Medication Dose Route Frequency Provider Last Rate Last Admin  . ceFAZolin (ANCEF) 2-4 GM/100ML-% IVPB            . ceFAZolin (ANCEF) IVPB 2g/100 mL premix  2 g Intravenous On Call to OR Pete Pelt, PA-C      . lactated ringers infusion   Intravenous Continuous Nolon Nations, MD 10 mL/hr at 02/08/20 1012 New Bag at 02/08/20 1012  . tranexamic acid (CYKLOKAPRON) 1000MG /135mL IVPB           . tranexamic acid (CYKLOKAPRON) IVPB 1,000 mg  1,000 mg Intravenous To OR Pete Pelt, PA-C       No Known Allergies  Social History   Tobacco Use  . Smoking status: Former Smoker    Types: Cigarettes    Quit date: 1965    Years since quitting: 57.0  . Smokeless tobacco: Never Used  Substance Use Topics  . Alcohol use: Yes    Comment: occasional    History reviewed. No pertinent family history.   Review of Systems  Musculoskeletal: Positive for gait problem.  All other systems reviewed and are negative.   Objective:  Physical Exam Vitals reviewed.  Constitutional:      Appearance: Normal appearance.  HENT:     Head: Normocephalic and atraumatic.  Eyes:     Extraocular Movements: Extraocular movements intact.     Pupils: Pupils are equal, round, and reactive to light.  Cardiovascular:     Rate and Rhythm: Normal rate.     Pulses: Normal pulses.  Pulmonary:     Effort: Pulmonary effort is normal.  Abdominal:     Palpations: Abdomen is soft.  Musculoskeletal:     Cervical back: Normal range  of motion.     Left hip: Tenderness and bony tenderness present. Decreased range of motion. Decreased strength.  Neurological:     Mental Status: He is alert and oriented to person, place, and time.  Psychiatric:        Behavior: Behavior normal.     Vital signs in last 24 hours: Temp:  [97.9 F (36.6 C)] 97.9 F (36.6 C) (01/11 0937) Pulse Rate:  [68] 68 (01/11 0937) BP: (131)/(70) 131/70 (01/11 0937) SpO2:  [99 %] 99 % (01/11 0937) Weight:  [75.2 kg] 75.2 kg (01/11 0937)  Labs:   Estimated body mass index is 26.76 kg/m as calculated from the following:   Height as of this  encounter: 5\' 6"  (1.676 m).   Weight as of this encounter: 75.2 kg.   Imaging Review Plain radiographs demonstrate severe degenerative joint disease of the left hip(s). The bone quality appears to be good for age and reported activity level.      Assessment/Plan:  End stage arthritis, left hip(s)  The patient history, physical examination, clinical judgement of the provider and imaging studies are consistent with end stage degenerative joint disease of the left hip(s) and total hip arthroplasty is deemed medically necessary. The treatment options including medical management, injection therapy, arthroscopy and arthroplasty were discussed at length. The risks and benefits of total hip arthroplasty were presented and reviewed. The risks due to aseptic loosening, infection, stiffness, dislocation/subluxation,  thromboembolic complications and other imponderables were discussed.  The patient acknowledged the explanation, agreed to proceed with the plan and consent was signed. Patient is being admitted for inpatient treatment for surgery, pain control, PT, OT, prophylactic antibiotics, VTE prophylaxis, progressive ambulation and ADL's and discharge planning.The patient is planning to be discharged home with home health services

## 2020-02-08 NOTE — Plan of Care (Signed)

## 2020-02-08 NOTE — Anesthesia Postprocedure Evaluation (Signed)
Anesthesia Post Note  Patient: [name]  Procedure(s) Performed: LEFT TOTAL HIP ARTHROPLASTY ANTERIOR APPROACH (Left Hip)     Patient location during evaluation: PACU Anesthesia Type: Spinal Level of consciousness: oriented and awake and alert Pain management: pain level controlled Vital Signs Assessment: post-procedure vital signs reviewed and stable Respiratory status: spontaneous breathing, respiratory function stable and patient connected to nasal cannula oxygen Cardiovascular status: blood pressure returned to baseline and stable Postop Assessment: no headache, no backache and no apparent nausea or vomiting Anesthetic complications: no   No complications documented.  Last Vitals:  Vitals:   02/08/20 1430 02/08/20 1500  BP: (!) 150/91 (!) 147/69  Pulse: 68 (!) 41  Resp: 12 15  Temp: (!) 36 C (!) 36 C  SpO2: 100% 100%    Last Pain:  Vitals:   02/08/20 1500  TempSrc:   PainSc: 3                  Bassam Dresch

## 2020-02-08 NOTE — Op Note (Signed)
NAMECARLOUS, [name] MEDICAL RECORD [id number] ACCOUNT [id number] DATE OF BIRTH:06-Jan-1946 FACILITY: [facility] LOCATION: [id number] PHYSICIAN:Siraj Dermody [place], MD  OPERATIVE REPORT  DATE OF PROCEDURE:  02/08/2020  PREOPERATIVE DIAGNOSES:  Primary osteoarthritis and degenerative joint disease, left hip.  POSTOPERATIVE DIAGNOSES:  Primary osteoarthritis and degenerative joint disease, left hip.  PROCEDURE:  Left total hip arthroplasty through direct anterior approach.  IMPLANTS:  DePuy Sector Gription acetabular component size 54, size 36+0 neutral polyethylene liner, size 6 ACTIS femoral component with high offset, size 36+5 metal hip ball.  SURGEON:  Lind Guest. Ninfa Linden, MD  ASSISTANT:  Erskine Emery, PA-C  ANESTHESIA:  Spinal.  ANTIBIOTICS:  Two grams IV Ancef.  ESTIMATED BLOOD LOSS:  150 mL.  COMPLICATIONS:  None.  INDICATIONS:  The patient is a 75 year old gentleman with debilitating arthritis involving both his hips.  I actually replaced his right hip in the fall of this past year a few months ago.  He has done very well with that hip.  His left hip shows  debilitating arthritis on x-rays and clinical exam.  His pain is daily and is detrimentally affecting his mobility, his quality of life and his activities of daily living.  Having had a right hip replacement done a few months ago, he is fully aware of  the risk of acute blood loss anemia, nerve or vessel injury, fracture, infection, dislocation, DVT and implant failure.  He understands our goals are to decrease pain, improve mobility and overall improve quality of life.  DESCRIPTION OF PROCEDURE:  After informed consent was obtained, appropriate left hip was marked.  He was brought to the operating room and sat up on the stretcher where spinal anesthesia was obtained.  He was then laid in supine position on a stretcher.   Foley catheter was placed and both feet had traction boots applied to them.  Next, he was placed  supine on the Hana fracture table, the perineal post in place and both legs in line skeletal traction device and no traction applied.  His left operative  hip was prepped and draped with DuraPrep and sterile drapes.  A time-out was called and he was identified as correct patient, correct left hip.  We then made an incision just inferior and posterior to the anterior superior iliac spine and carried this  obliquely down the leg.  We dissected down tensor fascia lata muscle.  Tensor fascia was then divided longitudinally to proceed with direct anterior approach to the hip.  We identified and cauterized circumflex vessels and identified the hip capsule,  opened the hip capsule in an L-type format finding a moderate joint effusion and significant periarticular osteophytes around the lateral femoral head and neck.  I then made our femoral neck cut with an oscillating saw just proximal to the lesser  trochanter and completed this with osteotome.  I paced a corkscrew guide in the femoral head and removed the femoral head in its entirety and found a wide area devoid of cartilage.  I then placed a bent Hohmann over the medial acetabular rim and removed  remnants of the acetabular labrum and other debris.  I then began reaming under direct visualization from a size 44 reamer in stepwise increments going up to a size 53 with all reamers placed under direct visualization.  The last reamer was also placed  under direct fluoroscopy, so we could obtain our depth of reaming, our inclination and anteversion.  I then placed the real DePuy Sector Gription acetabular component size 54 and a  36+0 neutral polyethylene liner for that size acetabular component.   Attention was then turned to the femur.  With the leg externally rotated to 120 degrees, extended and adducted, we were able to place a Mueller retractor medially and a Hohmann retractor behind the greater trochanter.  We released lateral joint capsule  and used a  box-cutting osteotome to enter the femoral canal and a rongeur to lateralize, then began broaching using the ACTIS broaching system from a size 0 going up to a size 6.  With a size 6 in place, we trialed a high offset femoral neck and a 36+1.5  hip ball, reduced this in the acetabulum and based on our clinical exam and radiographic assessment, we felt like we needed just a little bit more offset and leg length.  He was stable on exam.  We dislocated the hip and removed the trial components.   We then placed the real high offset femoral component, size 6, which is ACTIS and then the real 36+5 metal hip ball and again reduced this in the acetabulum and we appreciated the stability on exam.  We assessed it radiographically as well.  We then  irrigated the soft tissue with normal saline solution using pulse lavage.  We closed the joint capsule with interrupted #1 Ethibond suture, followed by #1 Vicryl to close the tensor fascia, 0 Vicryl was used to close the deep tissue and 2-0 Vicryl was  used to close the subcutaneous tissue.  Skin was reapproximated with staples.  Xeroform and Aquacel dressing was applied.  He was taken off the Hana table and taken to recovery room in stable condition with all final counts being correct.  No  complications noted.  Of note, Benita Stabile, PA-C, assisted during the entire case.  His assistance was crucial for facilitating all aspects of this case.  HN/NUANCE  D:02/08/2020 T:02/08/2020 JOB:[id number]

## 2020-02-08 NOTE — Evaluation (Signed)
Physical Therapy Evaluation Patient Details Name: [name] MRN: [id number] DOB: 23-May-1945 Today's Date: 02/08/2020   History of Present Illness  Patient is 75 y.o. male s/p Lt THA anterior approach on 02/08/20 with PMH significant for prostate cancer, and OA, Rt THA in 2020.  Clinical Impression  Patient evaluated s/p Lt THA POD 0. Patient was limited by pain this session and proximal hip weakness 2/2 spinal block. Pt required MIN-MOD assist to complete transfers with RW with verbal cues to improve BOS and prevent LOB. Pt has intermittent help from his son at home. Anticipate patient will progress well with therapy as pain is managed and spinal block wears off. Educated patient on exercises to prevent DVT. Acute therapy will continue to progress patient as able.      Follow Up Recommendations Home health PT;Follow surgeon's recommendation for DC plan and follow-up therapies    Equipment Recommendations  None recommended by PT    Recommendations for Other Services       Precautions / Restrictions Precautions Precautions: Fall Restrictions Weight Bearing Restrictions: No LLE Weight Bearing: Weight bearing as tolerated      Mobility  Bed Mobility Overal bed mobility: Needs Assistance Bed Mobility: Supine to Sit     Supine to sit: Min assist     General bed mobility comments: Patient required assist with Lt LE and verbal cues for use of UEs on bed rails    Transfers Overall transfer level: Needs assistance Equipment used: Rolling walker (2 wheeled) Transfers: Sit to/from Stand Sit to Stand: Min assist;Mod assist;From elevated surface         General transfer comment: Patient was able to transfer sit to stand from EOB with MIN- MOD assist to rise and verbal cues to increase BOS with LEs for stability  Ambulation/Gait Ambulation/Gait assistance: Mod assist;+2 safety/equipment;+2 physical assistance Gait Distance (Feet): 6 Feet Assistive device: Rolling walker (2  wheeled) Gait Pattern/deviations: Step-to pattern;Decreased stride length;Narrow base of support;Decreased weight shift to left;Antalgic Gait velocity: decreased   General Gait Details: 2/2 spinal block patient has narrow BOS and requires MOD assist to prevent LOB while taking small steps with RW. distance limited 2/2 weakness and pain.  Stairs            Wheelchair Mobility    Modified Rankin (Stroke Patients Only)       Balance Overall balance assessment: Needs assistance Sitting-balance support: No upper extremity supported Sitting balance-Leahy Scale: Good     Standing balance support: Bilateral upper extremity supported Standing balance-Leahy Scale: Poor Standing balance comment: Patient demonstrated narrow BOS and increased unsteadiness on feet with transfer from bed to chair                             Pertinent Vitals/Pain Pain Assessment: 0-10 Pain Score: 5  Pain Location: Lt hip Pain Descriptors / Indicators: Aching;Discomfort Pain Intervention(s): Limited activity within patient's tolerance;Monitored during session;Repositioned;Patient requesting pain meds-RN notified;Ice applied    Home Living Family/patient expects to be discharged to:: Private residence Living Arrangements: Alone Available Help at Discharge: Family Type of Home: House Home Access: Stairs to enter Entrance Stairs-Rails: Right Entrance Stairs-Number of Steps: 3 Home Layout: One level Home Equipment: Environmental consultant - 2 wheels;Bedside commode;Cane - single point Additional Comments: son available intermittently    Prior Function Level of Independence: Independent               Hand Dominance   Dominant Hand: Right  Extremity/Trunk Assessment   Upper Extremity Assessment Upper Extremity Assessment: Overall WFL for tasks assessed    Lower Extremity Assessment Lower Extremity Assessment: LLE deficits/detail LLE Deficits / Details: Decreased strength LLE: Unable to  fully assess due to pain LLE Sensation: decreased proprioception LLE Coordination: decreased gross motor    Cervical / Trunk Assessment Cervical / Trunk Assessment: Normal  Communication   Communication: No difficulties  Cognition Arousal/Alertness: Awake/alert Behavior During Therapy: WFL for tasks assessed/performed Overall Cognitive Status: Within Functional Limits for tasks assessed                                        General Comments      Exercises Total Joint Exercises Ankle Circles/Pumps: AROM;Both;Seated;20 reps   Assessment/Plan    PT Assessment Patient needs continued PT services  PT Problem List Decreased strength;Decreased activity tolerance;Decreased balance;Decreased mobility;Pain;Decreased coordination       PT Treatment Interventions DME instruction;Gait training;Stair training;Functional mobility training;Therapeutic activities;Therapeutic exercise;Balance training;Neuromuscular re-education;Patient/family education;Modalities    PT Goals (Current goals can be found in the Care Plan section)  Acute Rehab PT Goals Patient Stated Goal: Decreased pain and to go home PT Goal Formulation: With patient Time For Goal Achievement: 02/15/20 Potential to Achieve Goals: Good    Frequency 7X/week   Barriers to discharge        Co-evaluation               AM-PAC PT "6 Clicks" Mobility  Outcome Measure Help needed turning from your back to your side while in a flat bed without using bedrails?: None Help needed moving from lying on your back to sitting on the side of a flat bed without using bedrails?: A Little Help needed moving to and from a bed to a chair (including a wheelchair)?: A Lot Help needed standing up from a chair using your arms (e.g., wheelchair or bedside chair)?: A Lot Help needed to walk in hospital room?: A Lot Help needed climbing 3-5 steps with a railing? : A Lot 6 Click Score: 15    End of Session Equipment  Utilized During Treatment: Gait belt Activity Tolerance: Patient tolerated treatment well;Patient limited by pain Patient left: in chair;with call bell/phone within reach;with chair alarm set Nurse Communication: Mobility status;Patient requests pain meds PT Visit Diagnosis: Unsteadiness on feet (R26.81);Muscle weakness (generalized) (M62.81);Pain Pain - Right/Left: Left Pain - part of body: Hip    Time: 3354-5625 PT Time Calculation (min) (ACUTE ONLY): 27 min   Charges:             Elna Breslow, SPT  Acute rehab    Elna Breslow 02/08/2020, 5:54 PM

## 2020-02-09 ENCOUNTER — Encounter (HOSPITAL_COMMUNITY): Payer: Self-pay | Admitting: Orthopaedic Surgery

## 2020-02-09 DIAGNOSIS — M1612 Unilateral primary osteoarthritis, left hip: Secondary | ICD-10-CM | POA: Diagnosis not present

## 2020-02-09 LAB — CBC
HCT: 34.8 % — ABNORMAL LOW (ref 39.0–52.0)
Hemoglobin: 11.6 g/dL — ABNORMAL LOW (ref 13.0–17.0)
MCH: 31.7 pg (ref 26.0–34.0)
MCHC: 33.3 g/dL (ref 30.0–36.0)
MCV: 95.1 fL (ref 80.0–100.0)
Platelets: 229 10*3/uL (ref 150–400)
RBC: 3.66 MIL/uL — ABNORMAL LOW (ref 4.22–5.81)
RDW: 13.2 % (ref 11.5–15.5)
WBC: 7.9 10*3/uL (ref 4.0–10.5)
nRBC: 0 % (ref 0.0–0.2)

## 2020-02-09 LAB — BASIC METABOLIC PANEL
Anion gap: 12 (ref 5–15)
BUN: 16 mg/dL (ref 8–23)
CO2: 21 mmol/L — ABNORMAL LOW (ref 22–32)
Calcium: 9.3 mg/dL (ref 8.9–10.3)
Chloride: 102 mmol/L (ref 98–111)
Creatinine, Ser: 1.12 mg/dL (ref 0.61–1.24)
GFR, Estimated: >60 mL/min (ref >60)
Glucose, Bld: 159 mg/dL — ABNORMAL HIGH (ref 70–99)
Potassium: 4.7 mmol/L (ref 3.5–5.1)
Sodium: 135 mmol/L (ref 135–145)

## 2020-02-09 NOTE — Plan of Care (Signed)
  Problem: Education: Goal: Knowledge of General Education information will improve Description: Including pain rating scale, medication(s)/side effects and non-pharmacologic comfort measures Outcome: Progressing   Problem: Health Behavior/Discharge Planning: Goal: Ability to manage health-related needs will improve Outcome: Progressing   Problem: Clinical Measurements: Goal: Ability to maintain clinical measurements within normal limits will improve Outcome: Progressing Goal: Will remain free from infection Outcome: Progressing Goal: Diagnostic test results will improve Outcome: Progressing Goal: Cardiovascular complication will be avoided Outcome: Progressing   Problem: Activity: Goal: Risk for activity intolerance will decrease Outcome: Progressing   Problem: Nutrition: Goal: Adequate nutrition will be maintained Outcome: Progressing   Problem: Coping: Goal: Level of anxiety will decrease Outcome: Progressing   Problem: Elimination: Goal: Will not experience complications related to bowel motility Outcome: Progressing Goal: Will not experience complications related to urinary retention Outcome: Progressing   Problem: Pain Managment: Goal: General experience of comfort will improve Outcome: Progressing   Problem: Safety: Goal: Ability to remain free from injury will improve Outcome: Progressing   Problem: Skin Integrity: Goal: Risk for impaired skin integrity will decrease Outcome: Progressing   Problem: Education: Goal: Knowledge of the prescribed therapeutic regimen will improve Outcome: Progressing Goal: Understanding of discharge needs will improve Outcome: Progressing Goal: Individualized Educational Video(s) Outcome: Progressing   Problem: Activity: Goal: Ability to avoid complications of mobility impairment will improve Outcome: Progressing Goal: Ability to tolerate increased activity will improve Outcome: Progressing   Problem: Clinical  Measurements: Goal: Postoperative complications will be avoided or minimized Outcome: Progressing   Problem: Pain Management: Goal: Pain level will decrease with appropriate interventions Outcome: Progressing   Problem: Skin Integrity: Goal: Will show signs of wound healing Outcome: Progressing   

## 2020-02-09 NOTE — Progress Notes (Addendum)
Physical Therapy Treatment Patient Details Name: [name] MRN: [id number] DOB: Jun 24, 1945 Today's Date: 02/09/2020    History of Present Illness Patient is 75 y.o. male s/p Lt THA anterior approach on 02/08/20 with PMH significant for prostate cancer, and OA, Rt THA in 2020.    PT Comments    POD # 1 pm session Assisted OOB to amb an increased distance.  General Gait Details: with much effort and grunting pt was self able to amb an increased distance to stairs just parked outside his room and back to bed.  Pt is determined to D/C today despite his current mobility level.   Pt lives home alone and plans for his son to "check on him" but admits the son will not be there 24/7.     Follow Up Recommendations  Home health PT;Follow surgeon's recommendation for DC plan and follow-up therapies     Equipment Recommendations  None recommended by PT    Recommendations for Other Services       Precautions / Restrictions Precautions Precautions: Fall Restrictions Weight Bearing Restrictions: No LLE Weight Bearing: Weight bearing as tolerated    Mobility  Bed Mobility Overal bed mobility: Needs Assistance Bed Mobility: Supine to Sit;Sit to Supine     Supine to sit: Min guard Sit to supine: Min assist   General bed mobility comments: demonstarted and instructed how to use belt to self assist LE on/off bed.  Pt required increased time and effort. Did require assist to support leg back onto bed as pt was unable.  Transfers Overall transfer level: Needs assistance Equipment used: Rolling walker (2 wheeled) Transfers: Sit to/from Stand Sit to Stand: Min guard         General transfer comment: with much effort and grunting, pt was able to self rise from bed but also present with poor forward flex posture and excessive use B UE's.  Ambulation/Gait Ambulation/Gait assistance: Supervision Gait Distance (Feet): 8 Feet Assistive device: Rolling walker (2 wheeled) Gait  Pattern/deviations: Step-to pattern;Decreased stride length;Narrow base of support;Decreased weight shift to left;Antalgic Gait velocity: decreased   General Gait Details: with much effort and grunting pt was self able to amb an increased distance to stairs just parked outside his room and back to bed.   Stairs Stairs: Yes Stairs assistance: Min guard;Min assist Stair Management: Two rails;Forwards;Backwards Number of Stairs: 2 General stair comments: pt was able to self recall proper sequencing and with much effort was able to asend 2 steps forward but required need for B rail use.  Then pt had to desend backward as he was unable to advance due to pain.   Wheelchair Mobility    Modified Rankin (Stroke Patients Only)       Balance                                            Cognition Arousal/Alertness: Awake/alert Behavior During Therapy: WFL for tasks assessed/performed Overall Cognitive Status: Within Functional Limits for tasks assessed                                 General Comments: AxO x 3 and determined to go home today despite pain level      Exercises      General Comments        Pertinent Vitals/Pain Pain Assessment:  Faces Faces Pain Scale: Hurts even more Pain Location: Lt hip Pain Descriptors / Indicators: Aching;Discomfort;Grimacing;Tender Pain Intervention(s): Monitored during session;Premedicated before session;Repositioned;Ice applied    Home Living                      Prior Function            PT Goals (current goals can now be found in the care plan section) Progress towards PT goals: Progressing toward goals    Frequency    7X/week      PT Plan Current plan remains appropriate    Co-evaluation              AM-PAC PT "6 Clicks" Mobility   Outcome Measure  Help needed turning from your back to your side while in a flat bed without using bedrails?: None Help needed moving from  lying on your back to sitting on the side of a flat bed without using bedrails?: A Little Help needed moving to and from a bed to a chair (including a wheelchair)?: A Little Help needed standing up from a chair using your arms (e.g., wheelchair or bedside chair)?: A Little Help needed to walk in hospital room?: A Little Help needed climbing 3-5 steps with a railing? : A Little 6 Click Score: 19    End of Session Equipment Utilized During Treatment: Gait belt Activity Tolerance: Patient tolerated treatment well;Patient limited by pain Patient left: with call bell/phone within reach Nurse Communication: Mobility status (pt requesting to go home around 3 pm) PT Visit Diagnosis: Unsteadiness on feet (R26.81);Muscle weakness (generalized) (M62.81);Pain Pain - Right/Left: Left Pain - part of body: Hip     Time: 8527-7824 PT Time Calculation (min) (ACUTE ONLY): 26 min  Charges:  $Gait Training: 8-22 mins $Therapeutic Activity: 8-22 mins                     Rica Koyanagi  PTA Acute  Rehabilitation Services Pager      [phone] Office      [phone]

## 2020-02-09 NOTE — Progress Notes (Signed)
Subjective: 1 Day Post-Op Procedure(s) (LRB): LEFT TOTAL HIP ARTHROPLASTY ANTERIOR APPROACH (Left) Patient reports pain as moderate.  Wanting to go home later today with his son.   Objective: Vital signs in last 24 hours: Temp:  [96.8 F (36 C)-98.2 F (36.8 C)] 98.2 F (36.8 C) (01/12 0104) Pulse Rate:  [34-74] 74 (01/12 0623) Resp:  [11-20] 15 (01/12 0623) BP: (117-152)/(62-91) 125/77 (01/12 0623) SpO2:  [94 %-100 %] 100 % (01/12 0623) Weight:  [75.2 kg] 75.2 kg (01/11 0937)  Intake/Output from previous day: 01/11 0701 - 01/12 0700 In: 1783.8 [P.O.:240; I.V.:1443.8; IV Piggyback:100] Out: 800 [Urine:650; Blood:150] Intake/Output this shift: No intake/output data recorded.  Recent Labs    02/09/20 0334  HGB 11.6*   Recent Labs    02/09/20 0334  WBC 7.9  RBC 3.66*  HCT 34.8*  PLT 229   Recent Labs    02/09/20 0334  NA 135  K 4.7  CL 102  CO2 21*  BUN 16  CREATININE 1.12  GLUCOSE 159*  CALCIUM 9.3   No results for input(s): LABPT, INR in the last 72 hours.  Dorsiflexion/Plantar flexion intact Incision: scant drainage Compartment soft   Assessment/Plan: 1 Day Post-Op Procedure(s) (LRB): LEFT TOTAL HIP ARTHROPLASTY ANTERIOR APPROACH (Left) Up with therapy  Possible discharge to home later today if he does well with PT and remains stable.       [name] 02/09/2020, 7:55 AM

## 2020-02-09 NOTE — Discharge Summary (Signed)
Patient ID: [name] MRN: [id number] DOB/AGE: [birth year] 75 y.o.  Admit date: 02/08/2020 Discharge date: 02/09/2020  Admission Diagnoses:  Principal Problem:   Unilateral primary osteoarthritis, left hip Active Problems:   Status post total replacement of left hip   Discharge Diagnoses:  Same  Past Medical History:  Diagnosis Date  . Arthritis   . Cancer ([place])    prostrate cancer  . Pneumonia    as a child    Surgeries: Procedure(s): LEFT TOTAL HIP ARTHROPLASTY ANTERIOR APPROACH on 02/08/2020   Consultants:   Discharged Condition: Improved  Hospital Course: [name] is an 75 y.o. male who was admitted 02/08/2020 for operative treatment ofUnilateral primary osteoarthritis, left hip. Patient has severe unremitting pain that affects sleep, daily activities, and work/hobbies. After pre-op clearance the patient was taken to the operating room on 02/08/2020 and underwent  Procedure(s): LEFT TOTAL HIP ARTHROPLASTY ANTERIOR APPROACH.    Patient was given perioperative antibiotics:  Anti-infectives (From admission, onward)   Start     Dose/Rate Route Frequency Ordered Stop   02/08/20 1800  ceFAZolin (ANCEF) IVPB 1 g/50 mL premix        1 g 100 mL/hr over 30 Minutes Intravenous Every 6 hours 02/08/20 1605 02/09/20 0127   02/08/20 0945  ceFAZolin (ANCEF) IVPB 2g/100 mL premix        2 g 200 mL/hr over 30 Minutes Intravenous On call to O.R. 02/08/20 0936 02/08/20 1157   02/08/20 0942  ceFAZolin (ANCEF) 2-4 GM/100ML-% IVPB       Note to Pharmacy: Kyra Leyland   : cabinet override      02/08/20 0942 02/08/20 1215       Patient was given sequential compression devices, early ambulation, and chemoprophylaxis to prevent DVT.  Patient benefited maximally from hospital stay and there were no complications.    Recent vital signs:  Patient Vitals for the past 24 hrs:  BP Temp Temp src Pulse Resp SpO2 Height Weight  02/09/20 0623 125/77 -- -- 74 15 100 % -- --  02/09/20 0104  122/67 98.2 F (36.8 C) Oral 70 16 94 % -- --  02/08/20 2222 135/70 98.2 F (36.8 C) Oral (!) 52 16 98 % -- --  02/08/20 1839 (!) 143/74 97.6 F (36.4 C) Oral (!) 56 20 99 % -- --  02/08/20 1744 128/69 97.9 F (36.6 C) Oral (!) 52 18 99 % -- --  02/08/20 1632 137/72 97.9 F (36.6 C) -- (!) 50 18 100 % -- --  02/08/20 1630 -- 97.9 F (36.6 C) Oral -- -- -- -- --  02/08/20 1549 (!) 152/81 (!) 97.5 F (36.4 C) Oral (!) 48 15 100 % -- --  02/08/20 1500 (!) 147/69 (!) 96.8 F (36 C) -- (!) 41 15 100 % -- --  02/08/20 1430 (!) 150/91 (!) 96.8 F (36 C) -- 68 12 100 % -- --  02/08/20 1415 (!) 148/75 -- -- 68 12 100 % -- --  02/08/20 1400 136/76 -- -- (!) 43 11 100 % -- --  02/08/20 1345 119/62 -- -- (!) 41 15 98 % -- --  02/08/20 1335 117/64 (!) 97.5 F (36.4 C) -- (!) 34 17 100 % -- --  02/08/20 0937 131/70 97.9 F (36.6 C) Oral 68 -- 99 % 5\' 6"  (1.676 m) 75.2 kg     Recent laboratory studies:  Recent Labs    02/09/20 0334  WBC 7.9  HGB 11.6*  HCT 34.8*  PLT 229  NA 135  K 4.7  CL 102  CO2 21*  BUN 16  CREATININE 1.12  GLUCOSE 159*  CALCIUM 9.3     Discharge Medications:   Allergies as of 02/09/2020   No Known Allergies     Medication List    TAKE these medications   aspirin 81 MG chewable tablet Chew 1 tablet (81 mg total) by mouth 2 (two) times daily.   cholecalciferol 25 MCG (1000 UNIT) tablet Commonly known as: VITAMIN D3 Take 1,000 Units by mouth daily.   Fish Oil 1000 MG Caps Take 1,000 mg by mouth daily.   methocarbamol 500 MG tablet Commonly known as: ROBAXIN Take 1 tablet (500 mg total) by mouth every 6 (six) hours as needed for muscle spasms.   oxyCODONE 5 MG immediate release tablet Commonly known as: Oxy IR/ROXICODONE Take 1-2 tablets (5-10 mg total) by mouth every 4 (four) hours as needed for moderate pain (pain score 4-6).   Xtandi 40 MG capsule Generic drug: enzalutamide Take 80 mg by mouth daily.            Durable Medical  Equipment  (From admission, onward)         Start     Ordered   02/08/20 1606  DME 3 n 1  Once        02/08/20 1605   02/08/20 1606  DME Walker rolling  Once       Question Answer Comment  Walker: With 5 Inch Wheels   Patient needs a walker to treat with the following condition Status post total replacement of left hip      02/08/20 1605          Diagnostic Studies: DG Pelvis Portable  Result Date: 02/08/2020 CLINICAL DATA:  Left hip replacement. EXAM: PORTABLE PELVIS 1-2 VIEWS COMPARISON:  02/08/2020. FINDINGS: Surgical clip staples noted over the pelvis. Total left hip replacement. Hardware intact. Anatomic alignment. Prior right hip replacement. IMPRESSION: Total left hip replacement.  Hardware intact.  Anatomic alignment. Electronically Signed   By: Marcello Moores  Register   On: 02/08/2020 14:37   DG C-Arm 1-60 Min-No Report  Result Date: 02/08/2020 Fluoroscopy was utilized by the requesting physician.  No radiographic interpretation.   DG HIP OPERATIVE UNILAT W OR W/O PELVIS LEFT  Result Date: 02/08/2020 CLINICAL DATA:  Left hip replacement. EXAM: OPERATIVE left HIP (WITH PELVIS IF PERFORMED) 3 VIEWS TECHNIQUE: Fluoroscopic spot image(s) were submitted for interpretation post-operatively. COMPARISON:  10/13/2018. FINDINGS: Total left hip replacement with anatomic alignment. Hardware intact. Prior right hip replacement. Surgical clips in the pelvis. IMPRESSION: Total left hip replacement with anatomic alignment. Electronically Signed   By: Marcello Moores  Register   On: 02/08/2020 13:20    Disposition: Discharge disposition: 01-Home or [place]    Mcarthur Rossetti, MD Follow up in 2 week(s).   Specialty: Orthopedic Surgery Contact information: [address] [place] [address] [phone]                Signed: Erskine Emery 02/09/2020, 8:07 AM

## 2020-02-09 NOTE — Progress Notes (Signed)
Physical Therapy Treatment Patient Details Name: [name] MRN: [id number] DOB: 29-Aug-1945 Today's Date: 02/09/2020    History of Present Illness Patient is 75 y.o. male s/p Lt THA anterior approach on 02/08/20 with PMH significant for prostate cancer, and OA, Rt THA in 2020.    PT Comments    POD # 1 am session Pt received IV Dilaudid prior to session issues og pain control.  Assisted OOB to recliner only. General bed mobility comments: demonstarted and instructed how to use belt to self assist LE on/off bed.  Pt required increased time and effort.  General transfer comment: with much effort and grunting, pt was able to self rise from bed but also present with poor forward flex posture and excessive use B UE's.  General Gait Details: with much effort and grunting pt was self able to amb a short distance from bed to recliner with poor flexed posture and difficulty advancing L LE. Pt will need another PT session to increase amb distance and practice stairs.    Follow Up Recommendations  Home health PT;Follow surgeon's recommendation for DC plan and follow-up therapies     Equipment Recommendations  None recommended by PT    Recommendations for Other Services       Precautions / Restrictions Precautions Precautions: Fall Restrictions Weight Bearing Restrictions: No LLE Weight Bearing: Weight bearing as tolerated    Mobility  Bed Mobility Overal bed mobility: Needs Assistance Bed Mobility: Supine to Sit;Sit to Supine     Supine to sit: Min guard Sit to supine: Min assist   General bed mobility comments: demonstarted and instructed how to use belt to self assist LE on/off bed.  Pt required increased time and effort.  Transfers Overall transfer level: Needs assistance Equipment used: Rolling walker (2 wheeled) Transfers: Sit to/from Stand Sit to Stand: Min guard;Supervision         General transfer comment: with much effort and grunting, pt was able to self rise from  bed but also present with poor forward flex posture and excessive use B UE's.  Ambulation/Gait Ambulation/Gait assistance: Min guard Gait Distance (Feet): 2 Feet Assistive device: Rolling walker (2 wheeled) Gait Pattern/deviations: Step-to pattern;Decreased stride length;Narrow base of support;Decreased weight shift to left;Antalgic Gait velocity: decreased   General Gait Details: with much effort and grunting pt was self able to amb a short distance from bed to recliner with poor flexed posture and difficulty advancing L LE.   Stairs             Wheelchair Mobility    Modified Rankin (Stroke Patients Only)       Balance                                            Cognition Arousal/Alertness: Awake/alert Behavior During Therapy: WFL for tasks assessed/performed Overall Cognitive Status: Within Functional Limits for tasks assessed                                 General Comments: AxO x 3 and determined to go home today despite pain level      Exercises      General Comments        Pertinent Vitals/Pain Pain Assessment: Faces Faces Pain Scale: Hurts even more Pain Location: Lt hip Pain Descriptors / Indicators: Aching;Discomfort;Grimacing;Tender Pain Intervention(s):  Monitored during session;Premedicated before session;Repositioned;Ice applied    Home Living                      Prior Function            PT Goals (current goals can now be found in the care plan section)      Frequency           PT Plan Current plan remains appropriate    Co-evaluation              AM-PAC PT "6 Clicks" Mobility   Outcome Measure  Help needed turning from your back to your side while in a flat bed without using bedrails?: None Help needed moving from lying on your back to sitting on the side of a flat bed without using bedrails?: A Little Help needed moving to and from a bed to a chair (including a wheelchair)?:  A Little Help needed standing up from a chair using your arms (e.g., wheelchair or bedside chair)?: A Little Help needed to walk in hospital room?: A Little Help needed climbing 3-5 steps with a railing? : A Lot 6 Click Score: 18    End of Session Equipment Utilized During Treatment: Gait belt Activity Tolerance: Patient tolerated treatment well;Patient limited by pain Patient left: in chair;with call bell/phone within reach;with chair alarm set Nurse Communication: Mobility status;Patient requests pain meds PT Visit Diagnosis: Unsteadiness on feet (R26.81);Muscle weakness (generalized) (M62.81);Pain Pain - Right/Left: Left     Time: 0017-4944 PT Time Calculation (min) (ACUTE ONLY): 24 min  Charges:  $Gait Training: 8-22 mins $Therapeutic Activity: 8-22 mins                     Rica Koyanagi  PTA Acute  Rehabilitation Services Pager      [phone] Office      [phone]

## 2020-02-09 NOTE — TOC Transition Note (Signed)
Transition of Care [facility]) - CM/SW Discharge Note   Patient Details  Name: [name] MRN: [id number] Date of Birth: Apr 28, 1945  Transition of Care [facility]) CM/SW Contact:  Lia Hopping, LCSW Phone Number: 02/09/2020, 10:51 AM   Clinical Narrative:    Therapy Plan: Prearranged [facility] Patient confirm he has DME   Final next level of care: [place] Barriers to Discharge: Barriers Resolved   Patient Goals and CMS Choice        Discharge Placement                       Discharge Plan and Services                          HH Arranged: PT [place] Agency: [facility] [employer] Date [place]: 02/09/20 Time [place]: [phone] Representative spoke with at [place]: [place] ([place]) Interventions     Readmission Risk Interventions No flowsheet data found.

## 2020-02-14 NOTE — Addendum Note (Signed)
Addendum  created 02/14/20 0639 by Rosaland Lao, CRNA   Intraprocedure Event edited

## 2020-02-15 NOTE — Addendum Note (Signed)
Addendum  created 02/15/20 0736 by Rosaland Lao, CRNA   Intraprocedure Event deleted

## 2020-02-22 ENCOUNTER — Inpatient Hospital Stay: Payer: Medicare Other | Admitting: Orthopaedic Surgery

## 2020-02-28 ENCOUNTER — Inpatient Hospital Stay: Payer: Medicare Other | Admitting: Orthopaedic Surgery

## 2020-03-02 ENCOUNTER — Other Ambulatory Visit: Payer: Self-pay

## 2020-03-02 ENCOUNTER — Ambulatory Visit (INDEPENDENT_AMBULATORY_CARE_PROVIDER_SITE_OTHER): Payer: Medicare Other | Admitting: Orthopaedic Surgery

## 2020-03-02 ENCOUNTER — Encounter: Payer: Self-pay | Admitting: Orthopaedic Surgery

## 2020-03-02 DIAGNOSIS — Z96642 Presence of left artificial hip joint: Secondary | ICD-10-CM

## 2020-03-02 NOTE — Progress Notes (Signed)
The patient is a 75 year old gentleman who is 2 weeks tomorrow status post a left total hip arthroplasty.  He has had his right hip replaced before.  He is fully aware of what is involved in recovery.  He is ambulate with a walker and says his range of motion and strength have done well and he is doing well overall.  He has been on a baby aspirin twice a day.  I did let him know he will go down to once a day baby aspirin for a week and then he can stop the aspirin.  I did remove the staples in place Steri-Strips.  There was a hematoma but no seroma.  His leg lengths are also equal.  At this point we will see him back in 4 weeks to see how he is doing overall.  He will continue to increase his activities and mobility as comfort allows.  No x-rays are needed in 4 weeks.

## 2020-03-09 ENCOUNTER — Inpatient Hospital Stay: Payer: Medicare Other | Admitting: Physician Assistant

## 2020-03-13 ENCOUNTER — Encounter: Payer: Self-pay | Admitting: Physician Assistant

## 2020-03-13 ENCOUNTER — Other Ambulatory Visit: Payer: Self-pay | Admitting: Urology

## 2020-03-13 ENCOUNTER — Ambulatory Visit (INDEPENDENT_AMBULATORY_CARE_PROVIDER_SITE_OTHER): Payer: Medicare Other | Admitting: Physician Assistant

## 2020-03-13 ENCOUNTER — Other Ambulatory Visit (HOSPITAL_COMMUNITY): Payer: Self-pay | Admitting: Urology

## 2020-03-13 DIAGNOSIS — C61 Malignant neoplasm of prostate: Secondary | ICD-10-CM

## 2020-03-13 DIAGNOSIS — Z96642 Presence of left artificial hip joint: Secondary | ICD-10-CM

## 2020-03-13 NOTE — Progress Notes (Signed)
HPI: Mr. [name] returns today status post left total hip arthroplasty 02/08/2020.  Patient states overall he is doing well not taking any pain medicine.  He does have some pain in his left knee he is not having prior to surgery.  He has had no known injury.  He does feel that he is slowly progressing towards provement still uses a cane.  Physical exam: Left hip surgical incisions healing well no signs of infection.  Fluid range of motion with slightly limited with internal and external rotation.  Left Knee Good Range Of Motion.  On the left is nontender.  Dorsiflexion plantarflexion left ankle intact.  Slight tenderness over the left trochanteric region.  Impression: Status post left total hip arthroplasty 02/08/2020  Plan: Continue to work on range of motion left hip.  Stop abduction exercises.  Follow-up with [place] in 1 month see how he is doing overall.  Questions encouraged and answered no x-rays at next office visit.

## 2020-04-04 ENCOUNTER — Other Ambulatory Visit: Payer: Self-pay

## 2020-04-04 ENCOUNTER — Encounter (HOSPITAL_COMMUNITY)
Admission: RE | Admit: 2020-04-04 | Discharge: 2020-04-04 | Disposition: A | Discharge status: Home / Self Care | Payer: Medicare Other | Source: Ambulatory Visit | Attending: Urology | Admitting: Urology

## 2020-04-04 DIAGNOSIS — C61 Malignant neoplasm of prostate: Secondary | ICD-10-CM | POA: Insufficient documentation

## 2020-04-04 MED ORDER — TECHNETIUM TC 99M MEDRONATE IV KIT
20.0000 | PACK | Freq: Once | INTRAVENOUS | Status: AC | PRN
  Administered 2020-04-04: 19.4 via INTRAVENOUS

## 2020-04-10 ENCOUNTER — Ambulatory Visit: Payer: Medicare Other | Admitting: Physician Assistant

## 2020-04-20 ENCOUNTER — Ambulatory Visit (INDEPENDENT_AMBULATORY_CARE_PROVIDER_SITE_OTHER): Payer: Medicare Other | Admitting: Physician Assistant

## 2020-04-20 ENCOUNTER — Encounter: Payer: Self-pay | Admitting: Physician Assistant

## 2020-04-20 DIAGNOSIS — Z96642 Presence of left artificial hip joint: Secondary | ICD-10-CM

## 2020-04-20 NOTE — Progress Notes (Signed)
HPI: Mr. [name] returns today status post left total hip arthroplasty 02/08/2020.  He states he is doing great.  He stopped doing the abduction exercises and his hip pain is diminishing.  He is using a cane to ambulate.  He has no complaints.  Is taking no pain medications.  Physical exam: Left hip excellent range of motion without pain.  Calf supple nontender.  Ambulates carrying a cane today.  Nonantalgic gait.  Impression: Status post left total hip arthroplasty 02/08/2020  Plan: He will continue work on range of motion strengthening of the left hip.  Follow-up with [place] in 6 months at that time we will obtain an AP pelvis and lateral view of the left hip.  He will follow-up sooner if there is any questions or concerns.  Questions were encouraged and answered at length.

## 2020-10-09 ENCOUNTER — Other Ambulatory Visit: Payer: Self-pay | Admitting: Urology

## 2020-10-09 DIAGNOSIS — M8580 Other specified disorders of bone density and structure, unspecified site: Secondary | ICD-10-CM

## 2020-10-09 DIAGNOSIS — C61 Malignant neoplasm of prostate: Secondary | ICD-10-CM

## 2020-10-31 ENCOUNTER — Other Ambulatory Visit: Payer: Medicare Other

## 2020-12-26 ENCOUNTER — Other Ambulatory Visit: Payer: Self-pay

## 2020-12-26 ENCOUNTER — Emergency Department (HOSPITAL_COMMUNITY)
Admission: EM | Admit: 2020-12-26 | Discharge: 2020-12-27 | Disposition: A | Discharge status: Home / Self Care | Payer: Medicare Other | Attending: Emergency Medicine | Admitting: Emergency Medicine

## 2020-12-26 DIAGNOSIS — M545 Low back pain, unspecified: Secondary | ICD-10-CM | POA: Insufficient documentation

## 2020-12-26 DIAGNOSIS — Z5321 Procedure and treatment not carried out due to patient leaving prior to being seen by health care provider: Secondary | ICD-10-CM | POA: Diagnosis not present

## 2020-12-26 NOTE — ED Triage Notes (Signed)
Pt here POC d/t lower back pain radiating to right leg X1 week. Increased in severity X3 days.

## 2020-12-27 NOTE — ED Notes (Signed)
PT was called X4 for vitals no answer.

## 2020-12-29 ENCOUNTER — Other Ambulatory Visit: Payer: Self-pay

## 2020-12-29 ENCOUNTER — Ambulatory Visit
Admission: EM | Admit: 2020-12-29 | Discharge: 2020-12-29 | Disposition: A | Discharge status: Home / Self Care | Payer: Medicare Other

## 2020-12-29 DIAGNOSIS — N50811 Right testicular pain: Secondary | ICD-10-CM | POA: Diagnosis not present

## 2020-12-29 DIAGNOSIS — M5441 Lumbago with sciatica, right side: Secondary | ICD-10-CM

## 2020-12-29 NOTE — ED Triage Notes (Signed)
Pt c/o intense pain to scrotum, lower back, buttocks radiating down right leg. States "I think it is sciatic."   Onset about 2 weeks ago that has progressively gotten more intense.

## 2020-12-29 NOTE — Discharge Instructions (Signed)
Please go to the emergency department as soon as you leave urgent care for further evaluation and management.  Please be advised that the [facility] may not have ultrasound technicians available.

## 2020-12-29 NOTE — ED Provider Notes (Signed)
[facility]    CSN: [id number] Arrival date & time: 12/29/20  0853      History   Chief Complaint Chief Complaint  Patient presents with   Testicle Pain    HPI [name] is a 75 y.o. male.   Patient presents with severe right testicular pain and right lower back pain that extends into right leg that started approximately 2 weeks ago.  Patient reports that the pain is intermittent.  Sitting exacerbates pain.  Pain is most severe in the testicle.  Denies any urinary burning, penile discharge, left testicular pain but does endorse that he has incomplete voiding since symptoms started.  Denies any known injury to the testicle or the back.  Denies any numbness or tingling.  Patient does report some sexual activity at times but denies any known exposure to STD.  Patient is followed by urology due to history of prostate cancer approximately 10 to 12 years ago.   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   Status post total replacement of right hip 10/13/2018   Unilateral primary osteoarthritis, left hip 04/01/2018   Unilateral primary osteoarthritis, right hip 04/01/2018    Past Surgical History:  Procedure Laterality Date   HERNIA REPAIR     right inguinal    PROSTATECTOMY     TOTAL HIP ARTHROPLASTY Right 10/13/2018   Procedure: RIGHT TOTAL HIP ARTHROPLASTY ANTERIOR APPROACH;  Surgeon: Mcarthur Rossetti, MD;  Location: [facility];  Service: Orthopedics;  Laterality: Right;   TOTAL HIP ARTHROPLASTY Left 02/08/2020   Procedure: LEFT TOTAL HIP ARTHROPLASTY ANTERIOR APPROACH;  Surgeon: Mcarthur Rossetti, MD;  Location: [facility];  Service: Orthopedics;  Laterality: Left;       Home Medications    Prior to Admission medications   Medication Sig Start Date End  Date Taking? Authorizing Provider  aspirin 81 MG chewable tablet Chew 1 tablet (81 mg total) by mouth 2 (two) times daily. 02/08/20   Mcarthur Rossetti, MD  cholecalciferol (VITAMIN D3) 25 MCG (1000 UNIT) tablet Take 1,000 Units by mouth daily.    [provider]  methocarbamol (ROBAXIN) 500 MG tablet Take 1 tablet (500 mg total) by mouth every 6 (six) hours as needed for muscle spasms. 02/08/20   Mcarthur Rossetti, MD  Omega-3 Fatty Acids (FISH OIL) 1000 MG CAPS Take 1,000 mg by mouth daily.    [provider]  XTANDI 40 MG capsule Take 80 mg by mouth daily. 12/27/19   [provider]    Family History History reviewed. No pertinent family history.  Social History Social History   Tobacco Use   Smoking status: Former    Types: Cigarettes    Quit date: 1965    Years since quitting: 57.9   Smokeless tobacco: Never  Vaping Use   Vaping Use: Never used  Substance Use Topics   Alcohol use: Yes    Comment: occasional   Drug use: Never     Allergies   Patient has no known allergies.   Review of Systems Review of Systems Per HPI  Physical Exam Triage Vital Signs ED Triage Vitals  Enc Vitals Group     BP 12/29/20 0918 (!) 162/61  Pulse Rate 12/29/20 0918 60     Resp 12/29/20 0918 18     Temp 12/29/20 0918 98.3 F (36.8 C)     Temp Source 12/29/20 0918 Oral     SpO2 12/29/20 0918 97 %     Weight --      Height --      Head Circumference --      Peak Flow --      Pain Score 12/29/20 0916 8     Pain Loc --      Pain Edu? --      Excl. in [place]? --    No data found.  Updated Vital Signs BP (!) 162/61 (BP Location: Right Arm)   Pulse 60   Temp 98.3 F (36.8 C) (Oral)   Resp 18   SpO2 97%   Visual Acuity Right Eye Distance:   Left Eye Distance:   Bilateral Distance:    Right Eye Near:   Left Eye Near:    Bilateral Near:     Physical Exam Exam conducted with a chaperone present.  Constitutional:      General: He is  not in acute distress.    Appearance: Normal appearance. He is not toxic-appearing or diaphoretic.  HENT:     Head: Normocephalic and atraumatic.  Eyes:     Extraocular Movements: Extraocular movements intact.     Conjunctiva/sclera: Conjunctivae normal.  Pulmonary:     Effort: Pulmonary effort is normal.  Genitourinary:    Penis: Normal. No erythema, tenderness, discharge, swelling or lesions.      Testes: Cremasteric reflex is present.        Right: Tenderness present. Mass or swelling not present.        Left: Mass, tenderness or swelling not present.     Epididymis:     Right: Normal.     Left: Normal.     Comments: Patient has tenderness to palpation to right testicle.  Patient is yelling in pain during physical exam from testicular pain. Musculoskeletal:     Cervical back: Normal.     Thoracic back: Normal.     Lumbar back: Tenderness present. No swelling, edema or bony tenderness. Negative right straight leg raise test and negative left straight leg raise test.       Back:     Comments: Tenderness to palpation to circled area on diagram.  Neurological:     General: No focal deficit present.     Mental Status: He is alert and oriented to person, place, and time. Mental status is at baseline.     Deep Tendon Reflexes: Reflexes are normal and symmetric.  Psychiatric:        Mood and Affect: Mood normal.        Behavior: Behavior normal.        Thought Content: Thought content normal.        Judgment: Judgment normal.     UC Treatments / Results  Labs (all labs ordered are listed, but only abnormal results are displayed) Labs Reviewed - No data to display  EKG   Radiology No results found.  Procedures Procedures (including critical care time)  Medications Ordered in UC Medications - No data to display  Initial Impression / Assessment and Plan / UC Course  I have reviewed the triage vital signs and the nursing notes.  Pertinent labs & imaging results that  were available during my care of the patient were reviewed by me and considered in my medical  decision making (see chart for details).     Differential diagnoses include right-sided sciatica, STD, urinary tract infection.  Although, patient will need to have more worrisome etiologies ruled out due to the severity of testicular pain found on exam.  Advised patient that it would be best for him to have an ultrasound of his testicle for further evaluation and  management.  Unable to order outpatient ultrasound at urgent care.  Patient advised to go to the hospital for further evaluation and management.  Patient was agreeable with plan.  Vital signs stable at discharge.  Agree patient self transport to the hospital. Final Clinical Impressions(s) / UC Diagnoses   Final diagnoses:  Pain in right testicle  Acute right-sided low back pain with right-sided sciatica     Discharge Instructions      Please go to the emergency department as soon as you leave urgent care for further evaluation and management.  Please be advised that the [facility] may not have ultrasound technicians available.    ED Prescriptions   None    PDMP not reviewed this encounter.   Teodora Medici, [place] 12/29/20 [phone]

## 2021-01-02 ENCOUNTER — Other Ambulatory Visit: Payer: Medicare Other

## 2021-01-08 ENCOUNTER — Other Ambulatory Visit: Payer: Self-pay | Admitting: Internal Medicine

## 2021-01-08 DIAGNOSIS — N50819 Testicular pain, unspecified: Secondary | ICD-10-CM

## 2021-01-08 DIAGNOSIS — Z8546 Personal history of malignant neoplasm of prostate: Secondary | ICD-10-CM

## 2021-01-10 ENCOUNTER — Ambulatory Visit
Admission: RE | Admit: 2021-01-10 | Discharge: 2021-01-10 | Disposition: A | Discharge status: Home / Self Care | Payer: Medicare Other | Source: Ambulatory Visit | Attending: Internal Medicine | Admitting: Internal Medicine

## 2021-01-10 DIAGNOSIS — N50819 Testicular pain, unspecified: Secondary | ICD-10-CM

## 2021-01-10 DIAGNOSIS — Z8546 Personal history of malignant neoplasm of prostate: Secondary | ICD-10-CM

## 2021-01-11 ENCOUNTER — Other Ambulatory Visit: Payer: Medicare Other

## 2021-01-15 ENCOUNTER — Other Ambulatory Visit: Payer: Self-pay | Admitting: Internal Medicine

## 2021-01-15 DIAGNOSIS — Z8546 Personal history of malignant neoplasm of prostate: Secondary | ICD-10-CM

## 2021-01-15 DIAGNOSIS — N50819 Testicular pain, unspecified: Secondary | ICD-10-CM

## 2022-01-22 IMAGING — US US SCROTUM W/ DOPPLER COMPLETE
1 series · 12 of 25 positions shown · non-contrast
Comparison: None
COMPARISON: None

Addendum:
CLINICAL DATA: A 75-year-old male with history of prostate cancer
presents with chronic RIGHT testicular pain with worsening in the
past month.

EXAM:
SCROTAL ULTRASOUND
DOPPLER ULTRASOUND OF THE TESTICLES
TECHNIQUE: Complete ultrasound examination of the testicles, epididymis, and
other scrotal structures was performed. Color and spectral Doppler
ultrasound were also utilized to evaluate blood flow to the
testicles.

[Series 1: us scrotum w/ doppler complete · 0.08mm/px · 12 of 55 slices shown]
[im 3/55]
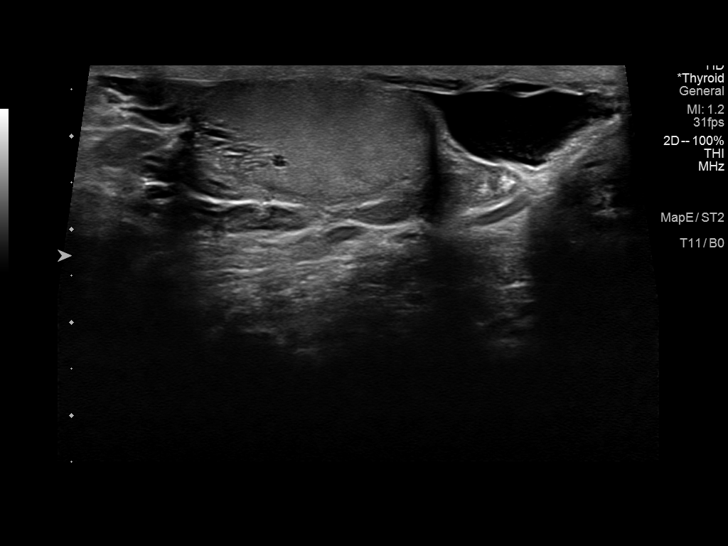
[im 7/55]
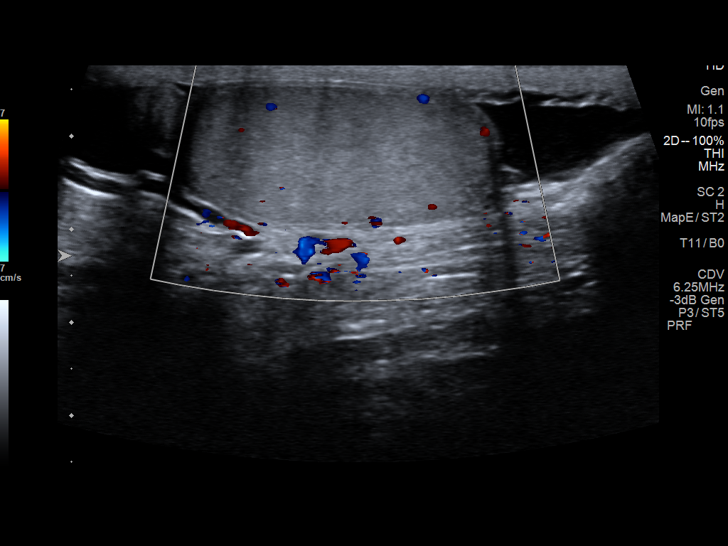
[im 12/55]
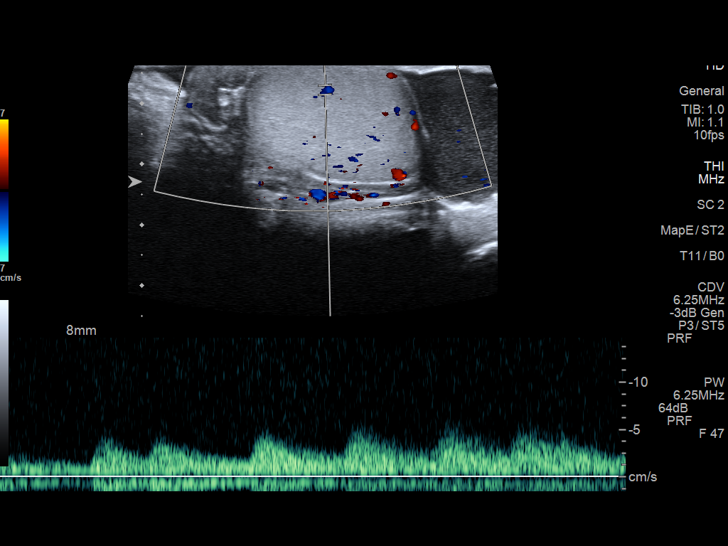
[im 16/55]
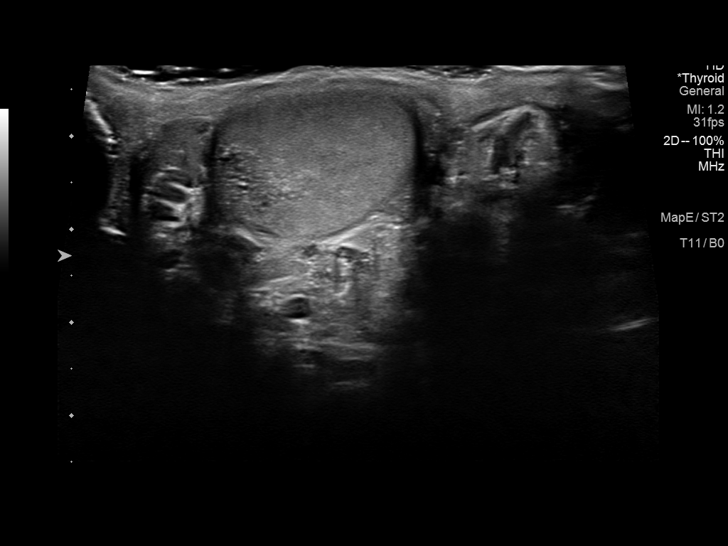
[im 21/55]
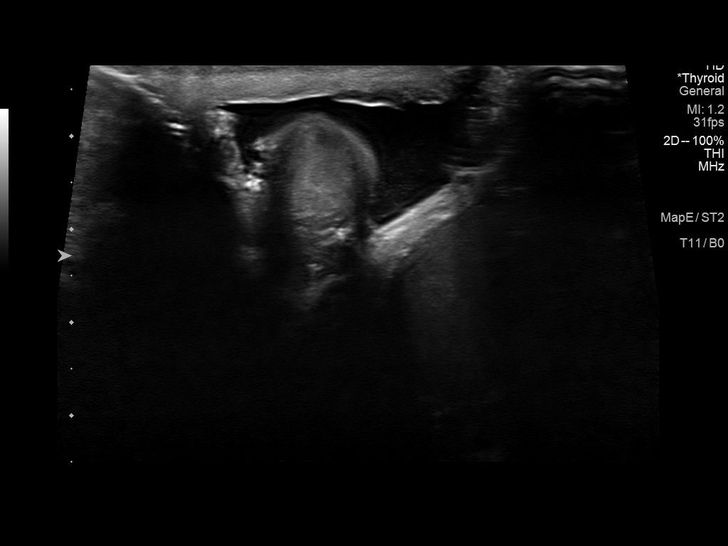
[im 25/55]
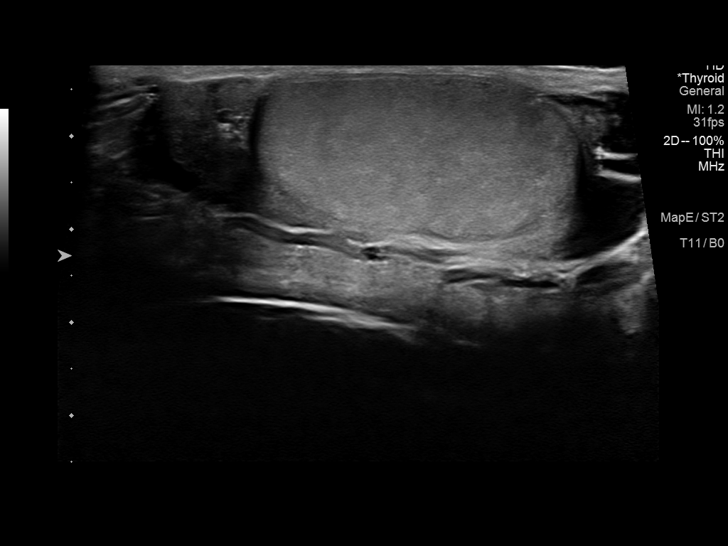
[im 30/55]
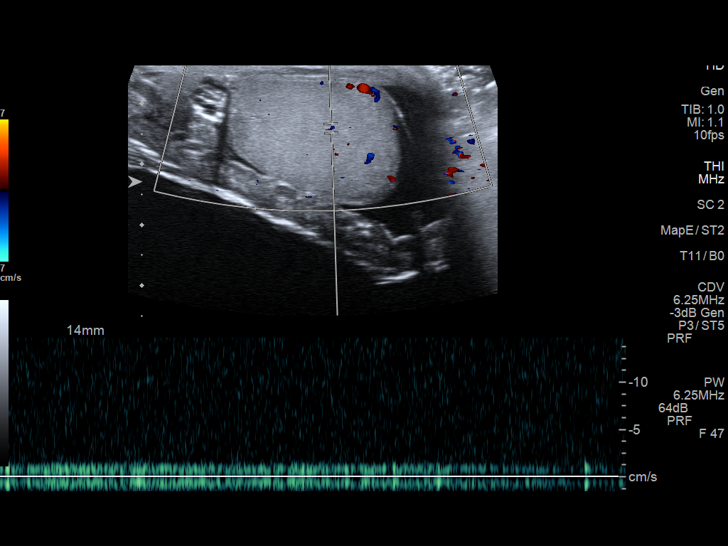
[im 34/55]
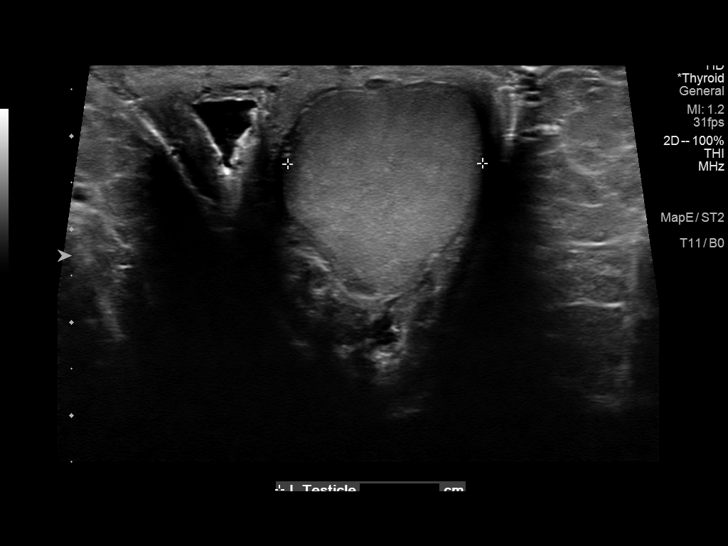
[im 39/55]
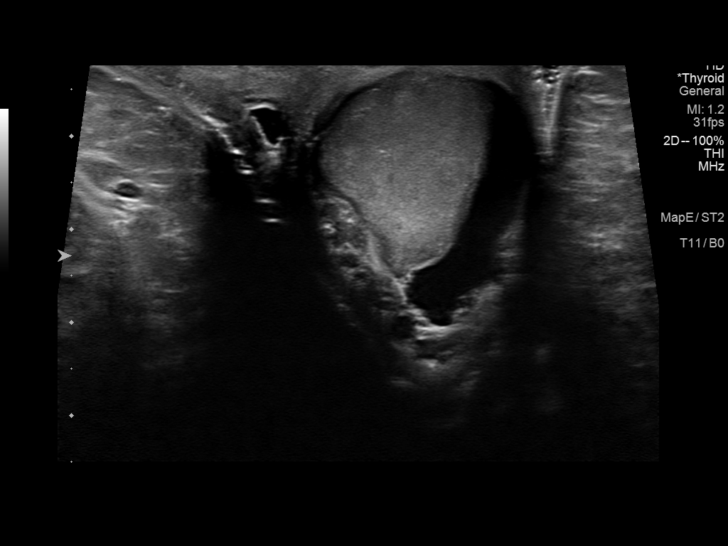
[im 43/55]
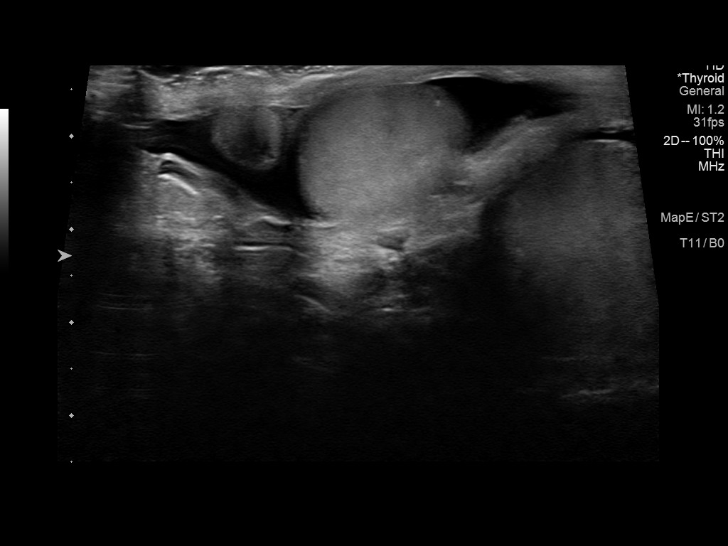
[im 48/55]
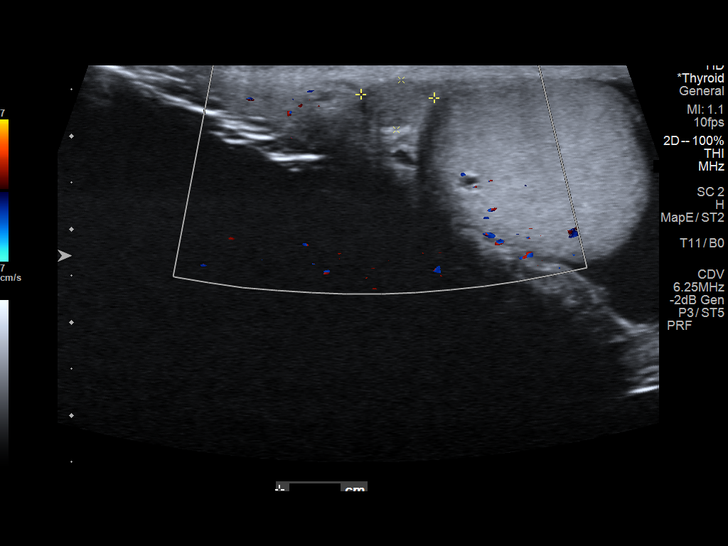
[im 52/55]
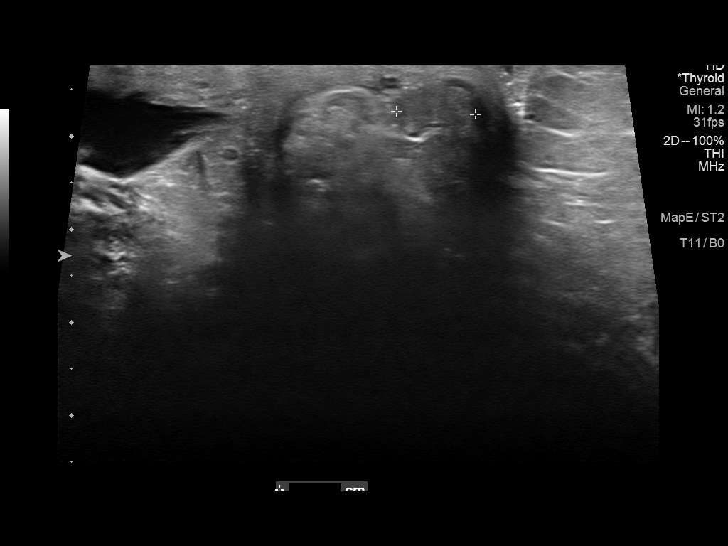

[12 of 25 positions shown; findings below may reference images not displayed]

FINDINGS: Right testicle

Measurements: 3.3 x 1.8 x 2.4 cm. Ectatic rete testes. No focal
lesion.

Left testicle

Measurements: 3.4 x 1.8 x 2.1 cm. Ectatic rete testes without mass.

Right epididymis:  Normal in size and appearance.

Left epididymis:  Normal in size and appearance.

Hydrocele:  Small bilateral hydroceles without septation.

Varicocele:  None visualized.

Pulsed Doppler interrogation of both testes demonstrates normal low
resistance flow pattern to the bilateral testes. Limited ability to
obtain LEFT testicular arterial waveforms though venous waveform is
normal. There is an arterial waveform that could be sampled within
the LEFT testis. This is more likely due to technical factors.
IMPRESSION: Normal appearance of bilateral testes.

Preserved low resistance flow to the bilateral testes without signs
of testicular torsion with mildly limited assessment of the LEFT
testicle as discussed below.

Limited ability to obtain arterial waveform on the LEFT but with
preserved venous waveform and with visible low resistance pattern,
less pronounced than on the RIGHT but present in the LEFT testis.
More likely related to technical factors particularly given that
symptoms are reported on the RIGHT. If there is worsening pain or
pain on the LEFT could consider repeat imaging as warranted.

ADDENDUM:
For clarification, the venous waveform on the LEFT is normal. There
is visible low resistance arterial flow on the LEFT though not to
the extent that is seen on the RIGHT.

*** End of Addendum ***
FINDINGS: Right testicle

Measurements: 3.3 x 1.8 x 2.4 cm. Ectatic rete testes. No focal
lesion.

Left testicle

Measurements: 3.4 x 1.8 x 2.1 cm. Ectatic rete testes without mass.

Right epididymis:  Normal in size and appearance.

Left epididymis:  Normal in size and appearance.

Hydrocele:  Small bilateral hydroceles without septation.

Varicocele:  None visualized.

Pulsed Doppler interrogation of both testes demonstrates normal low
resistance flow pattern to the bilateral testes. Limited ability to
obtain LEFT testicular arterial waveforms though venous waveform is
normal. There is an arterial waveform that could be sampled within
the LEFT testis. This is more likely due to technical factors.
IMPRESSION: Normal appearance of bilateral testes.

Preserved low resistance flow to the bilateral testes without signs
of testicular torsion with mildly limited assessment of the LEFT
testicle as discussed below.

Limited ability to obtain arterial waveform on the LEFT but with
preserved venous waveform and with visible low resistance pattern,
less pronounced than on the RIGHT but present in the LEFT testis.
More likely related to technical factors particularly given that
symptoms are reported on the RIGHT. If there is worsening pain or
pain on the LEFT could consider repeat imaging as warranted.

## 2022-03-12 ENCOUNTER — Ambulatory Visit (INDEPENDENT_AMBULATORY_CARE_PROVIDER_SITE_OTHER): Payer: 59 | Admitting: Orthopaedic Surgery

## 2022-03-12 ENCOUNTER — Encounter: Payer: Self-pay | Admitting: Orthopaedic Surgery

## 2022-03-12 ENCOUNTER — Ambulatory Visit (INDEPENDENT_AMBULATORY_CARE_PROVIDER_SITE_OTHER): Payer: 59

## 2022-03-12 DIAGNOSIS — M79604 Pain in right leg: Secondary | ICD-10-CM

## 2022-03-12 NOTE — Addendum Note (Signed)
Addended by: Robyne Peers on: 03/12/2022 10:53 AM   Modules accepted: Orders

## 2022-03-12 NOTE — Progress Notes (Signed)
Office Visit Note   Patient: [name]           Date of Birth: 25-Mar-1945           MRN: [id number] Visit Date: 03/12/2022              Requested by: Connor Lopes, MD [address] [facility] [place],  [place] [address] PCP: Connor Lopes, MD   Assessment & Plan: Visit Diagnoses:  1. Pain in right leg     Plan:  Will obtain an MRI to evaluate for HNP as a source of his right leg radicular symptoms.  Also evaluate for any metastatic lesions.  Questions were encouraged and answered by [name] and myself.  Follow-up after the MRI to prove results.  Recommended shoes with good arch support.  Follow-Up Instructions: Return for After MRI.   Orders:  Orders Placed This Encounter  Procedures   XR Lumbar Spine 2-3 Views   No orders of the defined types were placed in this encounter.     Procedures: No procedures performed   Clinical Data: No additional findings.   Subjective: Chief Complaint  Patient presents with   Right Leg - Pain    HPI [name] is a 77 year old male well-known to [name] service with a history of bilateral hip replacements.  Right total hip replacement was done in September 2020 left hip replacement January 2022.  States both hips are doing well.  Unfortunately since patient was last seen he was diagnosed with metastatic castrate resistant prostate cancer.  He is seen today for right leg pain and foot drop on the right side.  He does have some back pain but he notes that he has periodic sharp pain that goes down the right leg.  He notes he has decreased sensation both feet right greater than left.  He attributes this to the chemotherapy 6 6 and palliative radiation which he finished up within February 2023.  He notes that he had swelling in both legs and that this is improved.  He is on gabapentin for the nerve pain.  He is unsure if this really helps.  He is also on chronic anticoagulation.  Review of Systems Negative for fevers or  chills.  Objective: Vital Signs: There were no vitals taken for this visit.  Physical Exam Constitutional:      Appearance: He is not ill-appearing or diaphoretic.  Cardiovascular:     Pulses: Normal pulses.  Pulmonary:     Effort: Pulmonary effort is normal.  Neurological:     Mental Status: He is alert and oriented to person, place, and time.  Psychiatric:        Mood and Affect: Mood normal.     Ortho Exam Lumbar spine he has tenderness over the right lower paraspinous region.  Bilateral hips good range of motion without pain.  Positive straight leg raise on the right negative on the left.  Lower extremities 5 out of 5 strength throughout except for the right foot which he has 3 out of 5 with dorsiflexion of the foot and 0 out of 5 with plantarflexion of the right foot.  Bilateral feet subjective decrease sensation right greater than left to light touch throughout.  There is no rashes ulcerations or impending ulcers of either foot.  Dorsal pedal pulses are 2+ bilaterally.  Ambulates with a cane slight antalgic gait.  Bilateral feet pes planus. Specialty Comments:  No specialty comments available.  Imaging: XR Lumbar Spine 2-3 Views  Result Date: 03/12/2022 Lumbar spine 2 views: Mild degenerative changes.  L3 vertebral body with 2 areas of cystic changes.  No spondylolisthesis.  No acute fractures.  No obvious aggressive lytic lesions.  Bilateral hips on the AP view are visualized and appear well-seated.    PMFS History: Patient Active Problem List   Diagnosis Date Noted   Status post total replacement of left hip 02/08/2020   Status post total replacement of right hip 10/13/2018   Unilateral primary osteoarthritis, left hip 04/01/2018   Unilateral primary osteoarthritis, right hip 04/01/2018   Past Medical History:  Diagnosis Date   Arthritis    Cancer ([place])    prostrate cancer   Pneumonia    as a child    History reviewed. No pertinent family history.  Past Surgical  History:  Procedure Laterality Date   HERNIA REPAIR     right inguinal    PROSTATECTOMY     TOTAL HIP ARTHROPLASTY Right 10/13/2018   Procedure: RIGHT TOTAL HIP ARTHROPLASTY ANTERIOR APPROACH;  Surgeon: Mcarthur Rossetti, MD;  Location: [facility];  Service: Orthopedics;  Laterality: Right;   TOTAL HIP ARTHROPLASTY Left 02/08/2020   Procedure: LEFT TOTAL HIP ARTHROPLASTY ANTERIOR APPROACH;  Surgeon: Mcarthur Rossetti, MD;  Location: [facility];  Service: Orthopedics;  Laterality: Left;   Social History   Occupational History   Not on file  Tobacco Use   Smoking status: Former    Types: Cigarettes    Quit date: 1965    Years since quitting: 59.1   Smokeless tobacco: Never  Vaping Use   Vaping Use: Never used  Substance and Sexual Activity   Alcohol use: Yes    Comment: occasional   Drug use: Never   Sexual activity: Not on file

## 2022-03-12 NOTE — Progress Notes (Signed)
I have seen the patient and discussed his case with Benita Stabile, PA-C.  We both agree that MRI of the lumbar spine is warranted.  We can also see him at his next visit to go over the MRI of his lumbar spine and get an AP and lateral of his right knee.

## 2022-03-19 ENCOUNTER — Ambulatory Visit
Admission: RE | Admit: 2022-03-19 | Discharge: 2022-03-19 | Disposition: A | Discharge status: Home / Self Care | Payer: 59 | Source: Ambulatory Visit | Attending: Physician Assistant | Admitting: Physician Assistant

## 2022-03-19 DIAGNOSIS — M79604 Pain in right leg: Secondary | ICD-10-CM

## 2022-04-15 ENCOUNTER — Encounter: Payer: Self-pay | Admitting: Orthopaedic Surgery

## 2022-04-15 ENCOUNTER — Ambulatory Visit (INDEPENDENT_AMBULATORY_CARE_PROVIDER_SITE_OTHER): Payer: 59 | Admitting: Orthopaedic Surgery

## 2022-04-15 DIAGNOSIS — M79604 Pain in right leg: Secondary | ICD-10-CM

## 2022-04-15 NOTE — Progress Notes (Signed)
The patient is well-known to [place].  He is a 77 year old gentleman who we have replaced his hips.  His hips have been doing great but he has been experiencing some low back pain and right sciatic type of pain.  He has a history of prostate cancer and has had radiation therapy to his pelvis.  We sent her for an MRI due to the pain that he was having that was radicular in nature.  He is here for review of this today.  He does report that he is feeling better.  He denies any back pain and he says when he stretches any "pops" his back he does much better.  Both his hips move smoothly and fluidly.  He has some slight decrease strength in his right lower extremity and he does have lymphedema.  There is no pain to palpation over the sacrum.  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  There is no significant nerve compression.  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  Right now I would not recommend any type of intervention since he is doing better overall.  This did give him reassurance and he will continue to follow-up with his urologist and his primary care physician as well as his oncologist.  If things worsen he will let us know.
